# Patient Record
Sex: Male | Born: 1960 | Race: White | Hispanic: No | Marital: Married | Smoking: Never smoker
Health system: Southern US, Community
[De-identification: ages and names within clinical notes are randomized; demographics above are authoritative.]

## PROBLEM LIST (undated history)

## (undated) DIAGNOSIS — I10 Essential (primary) hypertension: Secondary | ICD-10-CM

## (undated) DIAGNOSIS — D649 Anemia, unspecified: Secondary | ICD-10-CM

## (undated) DIAGNOSIS — Z8719 Personal history of other diseases of the digestive system: Secondary | ICD-10-CM

## (undated) DIAGNOSIS — J189 Pneumonia, unspecified organism: Secondary | ICD-10-CM

## (undated) DIAGNOSIS — K5732 Diverticulitis of large intestine without perforation or abscess without bleeding: Secondary | ICD-10-CM

## (undated) DIAGNOSIS — E119 Type 2 diabetes mellitus without complications: Secondary | ICD-10-CM

## (undated) DIAGNOSIS — T8859XA Other complications of anesthesia, initial encounter: Secondary | ICD-10-CM

## (undated) HISTORY — PX: COLONOSCOPY WITH ESOPHAGOGASTRODUODENOSCOPY (EGD): SHX5779

## (undated) HISTORY — PX: HERNIA REPAIR: SHX51

## (undated) HISTORY — PX: COLON SURGERY: SHX602

## (undated) HISTORY — PX: LAPAROSCOPIC INGUINAL HERNIA WITH UMBILICAL HERNIA: SHX5658

---

## 2012-07-23 ENCOUNTER — Encounter: Payer: Self-pay | Admitting: Gastroenterology

## 2012-08-16 ENCOUNTER — Ambulatory Visit: Payer: Self-pay | Admitting: Gastroenterology

## 2020-11-08 ENCOUNTER — Other Ambulatory Visit: Payer: Self-pay | Admitting: Orthopedic Surgery

## 2020-11-16 ENCOUNTER — Encounter
Admission: RE | Admit: 2020-11-16 | Discharge: 2020-11-16 | Disposition: A | Discharge status: Home / Self Care | Payer: BC Managed Care – PPO | Source: Ambulatory Visit | Attending: Orthopedic Surgery | Admitting: Orthopedic Surgery

## 2020-11-16 ENCOUNTER — Other Ambulatory Visit: Payer: Self-pay

## 2020-11-16 DIAGNOSIS — Z01818 Encounter for other preprocedural examination: Secondary | ICD-10-CM | POA: Diagnosis present

## 2020-11-16 HISTORY — DX: Anemia, unspecified: D64.9

## 2020-11-16 HISTORY — DX: Pneumonia, unspecified organism: J18.9

## 2020-11-16 HISTORY — DX: Diverticulitis of large intestine without perforation or abscess without bleeding: K57.32

## 2020-11-16 HISTORY — DX: Type 2 diabetes mellitus without complications: E11.9

## 2020-11-16 HISTORY — DX: Essential (primary) hypertension: I10

## 2020-11-16 HISTORY — DX: Personal history of other diseases of the digestive system: Z87.19

## 2020-11-16 HISTORY — DX: Other complications of anesthesia, initial encounter: T88.59XA

## 2020-11-16 LAB — COMPREHENSIVE METABOLIC PANEL
ALT: 19 U/L (ref 0–44)
AST: 14 U/L — ABNORMAL LOW (ref 15–41)
Albumin: 3.9 g/dL (ref 3.5–5.0)
Alkaline Phosphatase: 60 U/L (ref 38–126)
Anion gap: 9 (ref 5–15)
BUN: 27 mg/dL — ABNORMAL HIGH (ref 8–23)
CO2: 24 mmol/L (ref 22–32)
Calcium: 9 mg/dL (ref 8.9–10.3)
Chloride: 103 mmol/L (ref 98–111)
Creatinine, Ser: 1.07 mg/dL (ref 0.61–1.24)
GFR, Estimated: >60 mL/min (ref >60)
Glucose, Bld: 260 mg/dL — ABNORMAL HIGH (ref 70–99)
Potassium: 4.3 mmol/L (ref 3.5–5.1)
Sodium: 136 mmol/L (ref 135–145)
Total Bilirubin: 1.1 mg/dL (ref 0.3–1.2)
Total Protein: 7.4 g/dL (ref 6.5–8.1)

## 2020-11-16 LAB — URINALYSIS, ROUTINE W REFLEX MICROSCOPIC
Bacteria, UA: NONE SEEN
Bilirubin Urine: NEGATIVE
Glucose, UA: >=500 mg/dL — AB
Hgb urine dipstick: NEGATIVE
Ketones, ur: NEGATIVE mg/dL
Leukocytes,Ua: NEGATIVE
Nitrite: NEGATIVE
Protein, ur: NEGATIVE mg/dL
Specific Gravity, Urine: 1.029 (ref 1.005–1.030)
pH: 5 (ref 5.0–8.0)

## 2020-11-16 LAB — CBC WITH DIFFERENTIAL/PLATELET
Abs Immature Granulocytes: 0.03 10*3/uL (ref 0.00–0.07)
Basophils Absolute: 0.1 10*3/uL (ref 0.0–0.1)
Basophils Relative: 1 %
Eosinophils Absolute: 0.3 10*3/uL (ref 0.0–0.5)
Eosinophils Relative: 4 %
HCT: 44.2 % (ref 39.0–52.0)
Hemoglobin: 15.5 g/dL (ref 13.0–17.0)
Immature Granulocytes: 0 %
Lymphocytes Relative: 23 %
Lymphs Abs: 1.7 10*3/uL (ref 0.7–4.0)
MCH: 32.1 pg (ref 26.0–34.0)
MCHC: 35.1 g/dL (ref 30.0–36.0)
MCV: 91.5 fL (ref 80.0–100.0)
Monocytes Absolute: 0.6 10*3/uL (ref 0.1–1.0)
Monocytes Relative: 8 %
Neutro Abs: 4.7 10*3/uL (ref 1.7–7.7)
Neutrophils Relative %: 64 %
Platelets: 237 10*3/uL (ref 150–400)
RBC: 4.83 MIL/uL (ref 4.22–5.81)
RDW: 12.6 % (ref 11.5–15.5)
WBC: 7.3 10*3/uL (ref 4.0–10.5)
nRBC: 0 % (ref 0.0–0.2)

## 2020-11-16 LAB — TYPE AND SCREEN
ABO/RH(D): O POS
Antibody Screen: NEGATIVE

## 2020-11-16 LAB — SURGICAL PCR SCREEN
MRSA, PCR: NEGATIVE
Staphylococcus aureus: POSITIVE — AB

## 2020-11-16 NOTE — Patient Instructions (Addendum)
Your procedure is scheduled on: 11/25/20 - Thursday Report to the Registration Desk on the 1st floor of the [facility]. To find out your arrival time, please call [phone] between 1PM - 3PM on: 11/24/20 - Wednesday - Report to [facility] on 11/23/20 between 8am and 1200 for your Covid test.  REMEMBER: Instructions that are not followed completely may result in serious medical risk, up to and including death; or upon the discretion of your surgeon and anesthesiologist your surgery may need to be rescheduled.  Do not eat food after midnight the night before surgery.  No gum chewing, lozengers or hard candies.  You may however, drink CLEAR liquids up to 2 hours before you are scheduled to arrive for your surgery. Do not drink anything within 2 hours of your scheduled arrival time. Type 1 and Type 2 diabetics should only drink water.  TAKE THESE MEDICATIONS THE MORNING OF SURGERY WITH A SIP OF WATER: none  - Stop taking FARXIGA 10 MG TABS tablet 72 hours before procedure beginning 11/22/20 .  Stop Metformin  2 days prior to surgery. Do not take 06/07, 06/08 and do not take the morning of surgery.  One week prior to surgery: Stop Anti-inflammatories (NSAIDS) such as Advil, Aleve, Ibuprofen, Motrin, Naproxen, Naprosyn and Aspirin based products such as Excedrin, Goodys Powder, BC Powder.  Stop ANY OVER THE COUNTER supplements until after surgery.  You may  take Tylenol if needed for pain up until the day of surgery.  No Alcohol for 24 hours before or after surgery.  No Smoking including e-cigarettes for 24 hours prior to surgery.  No chewable tobacco products for at least 6 hours prior to surgery.  No nicotine patches on the day of surgery.  Do not use any "recreational" drugs for at least a week prior to your surgery.  Please be advised that the combination of cocaine and anesthesia may have negative outcomes, up to and including death. If you test positive for cocaine, your  surgery will be cancelled.  On the morning of surgery brush your teeth with toothpaste and water, you may rinse your mouth with mouthwash if you wish. Do not swallow any toothpaste or mouthwash.  Do not wear jewelry, make-up, hairpins, clips or nail polish.  Do not wear lotions, powders, or perfumes.   Do not shave body from the neck down 48 hours prior to surgery just in case you cut yourself which could leave a site for infection.  Also, freshly shaved skin may become irritated if using the CHG soap.  Contact lenses, hearing aids and dentures may not be worn into surgery.  Do not bring valuables to the hospital. [facility] is not responsible for any missing/lost belongings or valuables.   Use CHG Soap or wipes as directed on instruction sheet.  Notify your doctor if there is any change in your medical condition (cold, fever, infection).  Wear comfortable clothing (specific to your surgery type) to the hospital.  Plan for stool softeners for home use; pain medications have a tendency to cause constipation. You can also help prevent constipation by eating foods high in fiber such as fruits and vegetables and drinking plenty of fluids as your diet allows.  After surgery, you can help prevent lung complications by doing breathing exercises.  Take deep breaths and cough every 1-2 hours. Your doctor may order a device called an Incentive Spirometer to help you take deep breaths. When coughing or sneezing, hold a pillow firmly against your incision  with both hands. This is called "splinting." Doing this helps protect your incision. It also decreases belly discomfort.  If you are being admitted to the hospital overnight, leave your suitcase in the car. After surgery it may be brought to your room.  If you are being discharged the day of surgery, you will not be allowed to drive home. You will need a responsible adult (18 years or older) to drive you home and stay with you that night.    If you are taking public transportation, you will need to have a responsible adult (18 years or older) with you. Please confirm with your physician that it is acceptable to use public transportation.   Please call the Pre-admissions Testing Dept. at [phone] if you have any questions about these instructions.  Surgery Visitation Policy:  Patients undergoing a surgery or procedure may have one family member or support person with them as long as that person is not COVID-19 positive or experiencing its symptoms.  That person may remain in the waiting area during the procedure.  Inpatient Visitation:    Visiting hours are 7 a.m. to 8 p.m. Inpatients will be allowed two visitors daily. The visitors may change each day during the patient's stay. No visitors under the age of 26. Any visitor under the age of 59 must be accompanied by an adult. The visitor must pass COVID-19 screenings, use hand sanitizer when entering and exiting the patient's room and wear a mask at all times, including in the patient's room. Patients must also wear a mask when staff or their visitor are in the room. Masking is required regardless of vaccination status.  Total Hip/Knee Replacement Preoperative Educational Video  To better prepare for surgery, please view our videos that explain the physical activity and discharge planning required to have the best surgical recovery at [facility].  [email or web address]      Questions? Call [phone] or email [email or web address]

## 2020-11-23 ENCOUNTER — Other Ambulatory Visit: Payer: Self-pay

## 2020-11-23 ENCOUNTER — Other Ambulatory Visit
Admission: RE | Admit: 2020-11-23 | Discharge: 2020-11-23 | Disposition: A | Discharge status: Home / Self Care | Payer: BC Managed Care – PPO | Source: Ambulatory Visit | Attending: Orthopedic Surgery | Admitting: Orthopedic Surgery

## 2020-11-23 DIAGNOSIS — Z01812 Encounter for preprocedural laboratory examination: Secondary | ICD-10-CM | POA: Insufficient documentation

## 2020-11-23 DIAGNOSIS — Z20822 Contact with and (suspected) exposure to covid-19: Secondary | ICD-10-CM | POA: Insufficient documentation

## 2020-11-23 LAB — SARS CORONAVIRUS 2 (TAT 6-24 HRS): SARS Coronavirus 2: NEGATIVE

## 2020-11-25 ENCOUNTER — Inpatient Hospital Stay: Payer: BC Managed Care – PPO

## 2020-11-25 ENCOUNTER — Inpatient Hospital Stay: Payer: BC Managed Care – PPO | Admitting: Anesthesiology

## 2020-11-25 ENCOUNTER — Encounter
Admission: RE | Disposition: A | Discharge status: Home / Self Care | Payer: Self-pay | Source: Home / Self Care | Attending: Orthopedic Surgery

## 2020-11-25 ENCOUNTER — Inpatient Hospital Stay
Admission: RE | Admit: 2020-11-25 | Discharge: 2020-12-04 | DRG: 469 | Disposition: A | Discharge status: Home / Self Care | Payer: BC Managed Care – PPO | Attending: Orthopedic Surgery | Admitting: Orthopedic Surgery

## 2020-11-25 ENCOUNTER — Encounter: Payer: Self-pay | Admitting: Orthopedic Surgery

## 2020-11-25 DIAGNOSIS — Z20822 Contact with and (suspected) exposure to covid-19: Secondary | ICD-10-CM | POA: Diagnosis present

## 2020-11-25 DIAGNOSIS — R319 Hematuria, unspecified: Secondary | ICD-10-CM | POA: Diagnosis not present

## 2020-11-25 DIAGNOSIS — Z7984 Long term (current) use of oral hypoglycemic drugs: Secondary | ICD-10-CM

## 2020-11-25 DIAGNOSIS — U071 COVID-19: Secondary | ICD-10-CM | POA: Diagnosis not present

## 2020-11-25 DIAGNOSIS — E119 Type 2 diabetes mellitus without complications: Secondary | ICD-10-CM | POA: Diagnosis present

## 2020-11-25 DIAGNOSIS — Z833 Family history of diabetes mellitus: Secondary | ICD-10-CM

## 2020-11-25 DIAGNOSIS — Z79891 Long term (current) use of opiate analgesic: Secondary | ICD-10-CM

## 2020-11-25 DIAGNOSIS — I119 Hypertensive heart disease without heart failure: Secondary | ICD-10-CM | POA: Diagnosis present

## 2020-11-25 DIAGNOSIS — Z419 Encounter for procedure for purposes other than remedying health state, unspecified: Secondary | ICD-10-CM

## 2020-11-25 DIAGNOSIS — Z79899 Other long term (current) drug therapy: Secondary | ICD-10-CM

## 2020-11-25 DIAGNOSIS — Z6841 Body Mass Index (BMI) 40.0 and over, adult: Secondary | ICD-10-CM

## 2020-11-25 DIAGNOSIS — R300 Dysuria: Secondary | ICD-10-CM | POA: Diagnosis not present

## 2020-11-25 DIAGNOSIS — G8918 Other acute postprocedural pain: Secondary | ICD-10-CM

## 2020-11-25 DIAGNOSIS — Z96649 Presence of unspecified artificial hip joint: Secondary | ICD-10-CM

## 2020-11-25 DIAGNOSIS — M1611 Unilateral primary osteoarthritis, right hip: Secondary | ICD-10-CM | POA: Diagnosis present

## 2020-11-25 HISTORY — PX: TOTAL HIP ARTHROPLASTY: SHX124

## 2020-11-25 LAB — POCT I-STAT, CHEM 8
BUN: 23 mg/dL (ref 8–23)
Calcium, Ion: 1.14 mmol/L — ABNORMAL LOW (ref 1.15–1.40)
Chloride: 104 mmol/L (ref 98–111)
Creatinine, Ser: 0.9 mg/dL (ref 0.61–1.24)
Glucose, Bld: 297 mg/dL — ABNORMAL HIGH (ref 70–99)
HCT: 44 % (ref 39.0–52.0)
Hemoglobin: 15 g/dL (ref 13.0–17.0)
Potassium: 4.2 mmol/L (ref 3.5–5.1)
Sodium: 136 mmol/L (ref 135–145)
TCO2: 20 mmol/L — ABNORMAL LOW (ref 22–32)

## 2020-11-25 LAB — ABO/RH: ABO/RH(D): O POS

## 2020-11-25 LAB — CBC
HCT: 39.3 % (ref 39.0–52.0)
Hemoglobin: 13.4 g/dL (ref 13.0–17.0)
MCH: 31.9 pg (ref 26.0–34.0)
MCHC: 34.1 g/dL (ref 30.0–36.0)
MCV: 93.6 fL (ref 80.0–100.0)
Platelets: 223 10*3/uL (ref 150–400)
RBC: 4.2 MIL/uL — ABNORMAL LOW (ref 4.22–5.81)
RDW: 12.3 % (ref 11.5–15.5)
WBC: 11.1 10*3/uL — ABNORMAL HIGH (ref 4.0–10.5)
nRBC: 0 % (ref 0.0–0.2)

## 2020-11-25 LAB — CREATININE, SERUM
Creatinine, Ser: 1.04 mg/dL (ref 0.61–1.24)
GFR, Estimated: >60 mL/min (ref >60)

## 2020-11-25 LAB — GLUCOSE, CAPILLARY
Glucose-Capillary: 204 mg/dL — ABNORMAL HIGH (ref 70–99)
Glucose-Capillary: 274 mg/dL — ABNORMAL HIGH (ref 70–99)

## 2020-11-25 SURGERY — ARTHROPLASTY, HIP, TOTAL, ANTERIOR APPROACH
Anesthesia: Spinal | Site: Hip | Laterality: Right

## 2020-11-25 MED ORDER — FENTANYL CITRATE (PF) 100 MCG/2ML IJ SOLN
INTRAMUSCULAR | Status: AC
  Administered 2020-11-25: 50 ug via INTRAVENOUS
  Filled 2020-11-25: qty 2

## 2020-11-25 MED ORDER — ACETAMINOPHEN 10 MG/ML IV SOLN
INTRAVENOUS | Status: AC
  Filled 2020-11-25: qty 100

## 2020-11-25 MED ORDER — SODIUM CHLORIDE 0.9 % IV SOLN: INTRAVENOUS | Status: DC

## 2020-11-25 MED ORDER — ALUM & MAG HYDROXIDE-SIMETH 200-200-20 MG/5ML PO SUSP: 30.0000 mL | ORAL | Status: DC | PRN

## 2020-11-25 MED ORDER — CHLORHEXIDINE GLUCONATE 0.12 % MT SOLN
15.0000 mL | Freq: Once | OROMUCOSAL | Status: AC
  Administered 2020-11-25: 15 mL via OROMUCOSAL

## 2020-11-25 MED ORDER — BUPIVACAINE LIPOSOME 1.3 % IJ SUSP
INTRAMUSCULAR | Status: AC
  Filled 2020-11-25: qty 20

## 2020-11-25 MED ORDER — ACETAMINOPHEN 500 MG PO TABS
1000.0000 mg | ORAL_TABLET | Freq: Four times a day (QID) | ORAL | Status: AC
  Administered 2020-11-25 – 2020-11-26 (×4): 1000 mg via ORAL
  Filled 2020-11-25 (×5): qty 2

## 2020-11-25 MED ORDER — ZOLPIDEM TARTRATE 5 MG PO TABS
5.0000 mg | ORAL_TABLET | Freq: Every evening | ORAL | Status: DC | PRN
  Administered 2020-11-26: 5 mg via ORAL
  Filled 2020-11-25: qty 1

## 2020-11-25 MED ORDER — FAMOTIDINE 20 MG PO TABS
ORAL_TABLET | ORAL | Status: AC
  Filled 2020-11-25: qty 1

## 2020-11-25 MED ORDER — HYDROXYZINE HCL 50 MG PO TABS
75.0000 mg | ORAL_TABLET | Freq: Every day | ORAL | Status: DC
  Administered 2020-11-25 – 2020-12-04 (×9): 75 mg via ORAL
  Filled 2020-11-25 (×12): qty 1

## 2020-11-25 MED ORDER — LISINOPRIL 20 MG PO TABS
30.0000 mg | ORAL_TABLET | Freq: Every day | ORAL | Status: DC
  Administered 2020-11-26 – 2020-12-04 (×9): 30 mg via ORAL
  Filled 2020-11-25 (×9): qty 1

## 2020-11-25 MED ORDER — PROPOFOL 10 MG/ML IV BOLUS
INTRAVENOUS | Status: DC | PRN
  Administered 2020-11-25: 50 mg via INTRAVENOUS
  Administered 2020-11-25: 150 mg via INTRAVENOUS
  Administered 2020-11-25: 50 mg via INTRAVENOUS

## 2020-11-25 MED ORDER — POLYETHYLENE GLYCOL 3350 17 G PO PACK
17.0000 g | PACK | Freq: Every day | ORAL | Status: DC | PRN
  Administered 2020-11-28: 17 g via ORAL
  Filled 2020-11-25: qty 1

## 2020-11-25 MED ORDER — INSULIN ASPART 100 UNIT/ML IJ SOLN: 5.0000 [IU] | Freq: Once | INTRAMUSCULAR | Status: AC

## 2020-11-25 MED ORDER — CEFAZOLIN IN SODIUM CHLORIDE 3-0.9 GM/100ML-% IV SOLN
3.0000 g | INTRAVENOUS | Status: AC
  Administered 2020-11-25: 3 g via INTRAVENOUS
  Filled 2020-11-25: qty 100

## 2020-11-25 MED ORDER — INSULIN ASPART 100 UNIT/ML IJ SOLN
0.0000 [IU] | Freq: Three times a day (TID) | INTRAMUSCULAR | Status: DC
  Administered 2020-11-26: 8 [IU] via SUBCUTANEOUS
  Administered 2020-11-26: 5 [IU] via SUBCUTANEOUS
  Administered 2020-11-27: 2 [IU] via SUBCUTANEOUS
  Administered 2020-11-27: 5 [IU] via SUBCUTANEOUS
  Administered 2020-11-27: 3 [IU] via SUBCUTANEOUS
  Administered 2020-11-28: 5 [IU] via SUBCUTANEOUS
  Administered 2020-11-28: 3 [IU] via SUBCUTANEOUS
  Administered 2020-11-28: 2 [IU] via SUBCUTANEOUS
  Administered 2020-11-29 – 2020-11-30 (×2): 3 [IU] via SUBCUTANEOUS
  Administered 2020-11-30 – 2020-12-01 (×2): 2 [IU] via SUBCUTANEOUS
  Administered 2020-12-01: 3 [IU] via SUBCUTANEOUS
  Administered 2020-12-01: 5 [IU] via SUBCUTANEOUS
  Administered 2020-12-02: 8 [IU] via SUBCUTANEOUS
  Filled 2020-11-25 (×16): qty 1

## 2020-11-25 MED ORDER — ACETAMINOPHEN 10 MG/ML IV SOLN
INTRAVENOUS | Status: DC | PRN
  Administered 2020-11-25: 1000 mg via INTRAVENOUS

## 2020-11-25 MED ORDER — FAMOTIDINE 20 MG PO TABS
20.0000 mg | ORAL_TABLET | Freq: Once | ORAL | Status: AC
  Administered 2020-11-25: 20 mg via ORAL

## 2020-11-25 MED ORDER — BISACODYL 10 MG RE SUPP
10.0000 mg | Freq: Every day | RECTAL | Status: DC | PRN
  Administered 2020-12-02: 10 mg via RECTAL
  Filled 2020-11-25: qty 1

## 2020-11-25 MED ORDER — BUPIVACAINE-EPINEPHRINE 0.25% -1:200000 IJ SOLN
INTRAMUSCULAR | Status: DC | PRN
  Administered 2020-11-25: 30 mL

## 2020-11-25 MED ORDER — CHLORHEXIDINE GLUCONATE 0.12 % MT SOLN
OROMUCOSAL | Status: AC
  Filled 2020-11-25: qty 15

## 2020-11-25 MED ORDER — ENOXAPARIN SODIUM 40 MG/0.4ML IJ SOSY
40.0000 mg | PREFILLED_SYRINGE | INTRAMUSCULAR | Status: DC
  Administered 2020-11-26 – 2020-12-04 (×9): 40 mg via SUBCUTANEOUS
  Filled 2020-11-25 (×9): qty 0.4

## 2020-11-25 MED ORDER — OXYCODONE HCL 5 MG PO TABS
5.0000 mg | ORAL_TABLET | ORAL | Status: DC | PRN
  Administered 2020-11-29 (×2): 10 mg via ORAL
  Administered 2020-11-29: 5 mg via ORAL
  Administered 2020-11-30 – 2020-12-02 (×6): 10 mg via ORAL
  Administered 2020-12-03: 5 mg via ORAL
  Administered 2020-12-03: 10 mg via ORAL
  Filled 2020-11-25 (×20): qty 2

## 2020-11-25 MED ORDER — METFORMIN HCL ER 500 MG PO TB24
500.0000 mg | ORAL_TABLET | Freq: Every day | ORAL | Status: DC
  Administered 2020-11-26 – 2020-12-04 (×9): 500 mg via ORAL
  Filled 2020-11-25 (×9): qty 1

## 2020-11-25 MED ORDER — SODIUM CHLORIDE 0.9 % IV SOLN
INTRAVENOUS | Status: DC | PRN
  Administered 2020-11-25: 60 mL

## 2020-11-25 MED ORDER — FENTANYL CITRATE (PF) 100 MCG/2ML IJ SOLN
25.0000 ug | INTRAMUSCULAR | Status: DC | PRN
  Administered 2020-11-25: 50 ug via INTRAVENOUS
  Administered 2020-11-25 (×2): 25 ug via INTRAVENOUS

## 2020-11-25 MED ORDER — ONDANSETRON HCL 4 MG PO TABS: 4.0000 mg | ORAL_TABLET | Freq: Four times a day (QID) | ORAL | Status: DC | PRN

## 2020-11-25 MED ORDER — PANTOPRAZOLE SODIUM 40 MG PO TBEC
40.0000 mg | DELAYED_RELEASE_TABLET | Freq: Every day | ORAL | Status: DC
  Administered 2020-11-26 – 2020-12-04 (×9): 40 mg via ORAL
  Filled 2020-11-25 (×9): qty 1

## 2020-11-25 MED ORDER — MIDAZOLAM HCL 2 MG/2ML IJ SOLN
INTRAMUSCULAR | Status: AC
  Filled 2020-11-25: qty 2

## 2020-11-25 MED ORDER — MIDAZOLAM HCL 2 MG/2ML IJ SOLN
INTRAMUSCULAR | Status: AC
  Administered 2020-11-25: 1 mg via INTRAVENOUS
  Filled 2020-11-25: qty 2

## 2020-11-25 MED ORDER — BUPIVACAINE HCL (PF) 0.5 % IJ SOLN
INTRAMUSCULAR | Status: DC | PRN
  Administered 2020-11-25: 2.7 mL

## 2020-11-25 MED ORDER — PHENYLEPHRINE HCL (PRESSORS) 10 MG/ML IV SOLN
INTRAVENOUS | Status: DC | PRN
  Administered 2020-11-25: 100 ug via INTRAVENOUS

## 2020-11-25 MED ORDER — SODIUM CHLORIDE 0.9 % IV SOLN
INTRAVENOUS | Status: DC | PRN
  Administered 2020-11-25: 30 ug/min via INTRAVENOUS

## 2020-11-25 MED ORDER — MIDAZOLAM HCL 2 MG/2ML IJ SOLN: 1.0000 mg | Freq: Once | INTRAMUSCULAR | Status: AC

## 2020-11-25 MED ORDER — ONDANSETRON HCL 4 MG/2ML IJ SOLN
INTRAMUSCULAR | Status: DC | PRN
  Administered 2020-11-25: 4 mg via INTRAVENOUS

## 2020-11-25 MED ORDER — FENTANYL CITRATE (PF) 100 MCG/2ML IJ SOLN
INTRAMUSCULAR | Status: DC | PRN
  Administered 2020-11-25: 100 ug via INTRAVENOUS

## 2020-11-25 MED ORDER — ACETAMINOPHEN 325 MG PO TABS
325.0000 mg | ORAL_TABLET | Freq: Four times a day (QID) | ORAL | Status: DC | PRN
  Administered 2020-11-26 – 2020-11-28 (×2): 650 mg via ORAL
  Filled 2020-11-25 (×2): qty 2

## 2020-11-25 MED ORDER — PHENOL 1.4 % MT LIQD
1.0000 | OROMUCOSAL | Status: DC | PRN
  Filled 2020-11-25: qty 177

## 2020-11-25 MED ORDER — PROPOFOL 500 MG/50ML IV EMUL
INTRAVENOUS | Status: DC | PRN
  Administered 2020-11-25: 50 ug/kg/min via INTRAVENOUS

## 2020-11-25 MED ORDER — MENTHOL 3 MG MT LOZG
1.0000 | LOZENGE | OROMUCOSAL | Status: DC | PRN
  Filled 2020-11-25: qty 9

## 2020-11-25 MED ORDER — OXYCODONE HCL 5 MG PO TABS
10.0000 mg | ORAL_TABLET | ORAL | Status: DC | PRN
Start: 2020-11-25 — End: 2020-12-04
  Administered 2020-11-25 – 2020-11-26 (×5): 15 mg via ORAL
  Administered 2020-11-27 – 2020-11-30 (×9): 10 mg via ORAL
  Administered 2020-11-30: 15 mg via ORAL
  Administered 2020-12-01 – 2020-12-03 (×6): 10 mg via ORAL
  Administered 2020-12-04: 15 mg via ORAL
  Filled 2020-11-25 (×2): qty 2
  Filled 2020-11-25: qty 3
  Filled 2020-11-25: qty 2
  Filled 2020-11-25: qty 3
  Filled 2020-11-25: qty 2
  Filled 2020-11-25 (×3): qty 3
  Filled 2020-11-25 (×3): qty 2
  Filled 2020-11-25 (×2): qty 3

## 2020-11-25 MED ORDER — ORAL CARE MOUTH RINSE: 15.0000 mL | Freq: Once | OROMUCOSAL | Status: AC

## 2020-11-25 MED ORDER — DOCUSATE SODIUM 100 MG PO CAPS
100.0000 mg | ORAL_CAPSULE | Freq: Two times a day (BID) | ORAL | Status: DC
  Administered 2020-11-25 – 2020-12-04 (×18): 100 mg via ORAL
  Filled 2020-11-25 (×19): qty 1

## 2020-11-25 MED ORDER — FENTANYL CITRATE (PF) 100 MCG/2ML IJ SOLN
INTRAMUSCULAR | Status: AC
  Filled 2020-11-25: qty 2

## 2020-11-25 MED ORDER — FAMOTIDINE 20 MG PO TABS
20.0000 mg | ORAL_TABLET | Freq: Once | ORAL | Status: DC
  Filled 2020-11-25 (×2): qty 1

## 2020-11-25 MED ORDER — HYDROMORPHONE HCL 1 MG/ML IJ SOLN
0.5000 mg | INTRAMUSCULAR | Status: DC | PRN
Start: 2020-11-25 — End: 2020-12-04
  Administered 2020-11-25 – 2020-11-26 (×3): 1 mg via INTRAVENOUS
  Filled 2020-11-25 (×3): qty 1

## 2020-11-25 MED ORDER — METHOCARBAMOL 1000 MG/10ML IJ SOLN
500.0000 mg | Freq: Four times a day (QID) | INTRAVENOUS | Status: DC | PRN
  Filled 2020-11-25: qty 5

## 2020-11-25 MED ORDER — PROPOFOL 1000 MG/100ML IV EMUL
INTRAVENOUS | Status: AC
  Filled 2020-11-25: qty 100

## 2020-11-25 MED ORDER — CEFAZOLIN SODIUM-DEXTROSE 2-4 GM/100ML-% IV SOLN
2.0000 g | Freq: Four times a day (QID) | INTRAVENOUS | Status: AC
  Administered 2020-11-25 (×2): 2 g via INTRAVENOUS
  Filled 2020-11-25 (×4): qty 100

## 2020-11-25 MED ORDER — ONDANSETRON HCL 4 MG/2ML IJ SOLN
INTRAMUSCULAR | Status: AC
  Filled 2020-11-25: qty 2

## 2020-11-25 MED ORDER — OXYCODONE HCL 5 MG PO TABS
5.0000 mg | ORAL_TABLET | Freq: Once | ORAL | Status: AC | PRN
Start: 2020-11-25 — End: 2020-11-25
  Administered 2020-11-25: 5 mg via ORAL

## 2020-11-25 MED ORDER — DIPHENHYDRAMINE HCL 12.5 MG/5ML PO ELIX: 12.5000 mg | ORAL_SOLUTION | ORAL | Status: DC | PRN

## 2020-11-25 MED ORDER — GABAPENTIN 300 MG PO CAPS
600.0000 mg | ORAL_CAPSULE | Freq: Every day | ORAL | Status: DC
  Administered 2020-11-25 – 2020-12-04 (×10): 600 mg via ORAL
  Filled 2020-11-25 (×10): qty 2

## 2020-11-25 MED ORDER — OXYCODONE HCL 5 MG PO TABS
ORAL_TABLET | ORAL | Status: AC
  Filled 2020-11-25: qty 1

## 2020-11-25 MED ORDER — INSULIN ASPART 100 UNIT/ML IJ SOLN
INTRAMUSCULAR | Status: AC
  Administered 2020-11-25: 5 [IU] via SUBCUTANEOUS
  Filled 2020-11-25: qty 1

## 2020-11-25 MED ORDER — TRAMADOL HCL 50 MG PO TABS
50.0000 mg | ORAL_TABLET | Freq: Four times a day (QID) | ORAL | Status: DC
  Administered 2020-11-25 – 2020-12-04 (×34): 50 mg via ORAL
  Filled 2020-11-25 (×35): qty 1

## 2020-11-25 MED ORDER — NEOMYCIN-POLYMYXIN B GU 40-200000 IR SOLN
Status: AC
  Filled 2020-11-25: qty 4

## 2020-11-25 MED ORDER — DAPAGLIFLOZIN PROPANEDIOL 5 MG PO TABS
10.0000 mg | ORAL_TABLET | Freq: Every day | ORAL | Status: DC
  Administered 2020-11-26 – 2020-12-04 (×9): 10 mg via ORAL
  Filled 2020-11-25 (×10): qty 2

## 2020-11-25 MED ORDER — METHOCARBAMOL 500 MG PO TABS
500.0000 mg | ORAL_TABLET | Freq: Four times a day (QID) | ORAL | Status: DC | PRN
  Administered 2020-11-25 – 2020-12-04 (×14): 500 mg via ORAL
  Filled 2020-11-25 (×16): qty 1

## 2020-11-25 MED ORDER — SUCCINYLCHOLINE CHLORIDE 20 MG/ML IJ SOLN
INTRAMUSCULAR | Status: DC | PRN
  Administered 2020-11-25: 120 mg via INTRAVENOUS

## 2020-11-25 MED ORDER — SODIUM CHLORIDE FLUSH 0.9 % IV SOLN
INTRAVENOUS | Status: AC
  Filled 2020-11-25: qty 40

## 2020-11-25 MED ORDER — MAGNESIUM CITRATE PO SOLN
1.0000 | Freq: Once | ORAL | Status: DC | PRN
  Filled 2020-11-25 (×2): qty 296

## 2020-11-25 MED ORDER — SEMAGLUTIDE 14 MG PO TABS: 14.0000 mg | ORAL_TABLET | Freq: Every morning | ORAL | Status: DC

## 2020-11-25 MED ORDER — BUPIVACAINE-EPINEPHRINE (PF) 0.25% -1:200000 IJ SOLN
INTRAMUSCULAR | Status: AC
  Filled 2020-11-25: qty 30

## 2020-11-25 MED ORDER — MIDAZOLAM HCL 5 MG/5ML IJ SOLN
INTRAMUSCULAR | Status: DC | PRN
  Administered 2020-11-25 (×2): 1 mg via INTRAVENOUS

## 2020-11-25 MED ORDER — GLIPIZIDE 5 MG PO TABS
10.0000 mg | ORAL_TABLET | Freq: Every day | ORAL | Status: DC
  Administered 2020-11-26 – 2020-12-04 (×9): 10 mg via ORAL
  Filled 2020-11-25 (×9): qty 2

## 2020-11-25 MED ORDER — ONDANSETRON HCL 4 MG/2ML IJ SOLN: 4.0000 mg | Freq: Four times a day (QID) | INTRAMUSCULAR | Status: DC | PRN

## 2020-11-25 MED ORDER — OXYCODONE HCL 5 MG/5ML PO SOLN: 5.0000 mg | Freq: Once | ORAL | Status: AC | PRN

## 2020-11-25 SURGICAL SUPPLY — 61 items
BLADE SAGITTAL AGGR TOOTH XLG (BLADE) ×3 IMPLANT
BNDG COHESIVE 6X5 TAN STRL LF (GAUZE/BANDAGES/DRESSINGS) ×9 IMPLANT
CANISTER SUCT 1200ML W/VALVE (MISCELLANEOUS) ×3 IMPLANT
CANISTER WOUND CARE 500ML ATS (WOUND CARE) ×3 IMPLANT
CHLORAPREP W/TINT 26 (MISCELLANEOUS) ×3 IMPLANT
COVER BACK TABLE REUSABLE LG (DRAPES) ×3 IMPLANT
COVER WAND RF STERILE (DRAPES) ×3 IMPLANT
DRAPE 3/4 80X56 (DRAPES) ×9 IMPLANT
DRAPE C-ARM XRAY 36X54 (DRAPES) ×3 IMPLANT
DRAPE INCISE IOBAN 66X60 STRL (DRAPES) IMPLANT
DRAPE POUCH INSTRU U-SHP 10X18 (DRAPES) ×3 IMPLANT
DRESSING SURGICEL FIBRLLR 1X2 (HEMOSTASIS) ×2 IMPLANT
DRSG MEPILEX SACRM 8.7X9.8 (GAUZE/BANDAGES/DRESSINGS) ×3 IMPLANT
DRSG OPSITE POSTOP 4X8 (GAUZE/BANDAGES/DRESSINGS) ×6 IMPLANT
DRSG SURGICEL FIBRILLAR 1X2 (HEMOSTASIS) ×6
ELECT BLADE 6.5 EXT (BLADE) ×3 IMPLANT
ELECT REM PT RETURN 9FT ADLT (ELECTROSURGICAL) ×3
ELECTRODE REM PT RTRN 9FT ADLT (ELECTROSURGICAL) ×1 IMPLANT
GLOVE SURG SYN 9.0  PF PI (GLOVE) ×4
GLOVE SURG SYN 9.0 PF PI (GLOVE) ×2 IMPLANT
GLOVE SURG UNDER POLY LF SZ9 (GLOVE) ×3 IMPLANT
GOWN SRG 2XL LVL 4 RGLN SLV (GOWNS) ×1 IMPLANT
GOWN STRL NON-REIN 2XL LVL4 (GOWNS) ×2
GOWN STRL REUS W/ TWL LRG LVL3 (GOWN DISPOSABLE) ×1 IMPLANT
GOWN STRL REUS W/TWL LRG LVL3 (GOWN DISPOSABLE) ×2
HEMOVAC 400CC 10FR (MISCELLANEOUS) IMPLANT
HIP DBL LINER 54X28 (Liner) ×3 IMPLANT
HIP FEM HD S 28 (Head) ×3 IMPLANT
HOLDER FOLEY CATH W/STRAP (MISCELLANEOUS) ×3 IMPLANT
HOOD PEEL AWAY FLYTE STAYCOOL (MISCELLANEOUS) ×3 IMPLANT
IRRIGATION SURGIPHOR STRL (IV SOLUTION) IMPLANT
KIT PREVENA INCISION MGT 13 (CANNISTER) ×3 IMPLANT
MANIFOLD NEPTUNE II (INSTRUMENTS) ×3 IMPLANT
MASTERLOC HIP LATERAL S7 (Hips) ×3 IMPLANT
MAT ABSORB  FLUID 56X50 GRAY (MISCELLANEOUS) ×2
MAT ABSORB FLUID 56X50 GRAY (MISCELLANEOUS) ×1 IMPLANT
NDL SAFETY ECLIPSE 18X1.5 (NEEDLE) ×1 IMPLANT
NEEDLE HYPO 18GX1.5 SHARP (NEEDLE) ×2
NEEDLE SPNL 20GX3.5 QUINCKE YW (NEEDLE) ×6 IMPLANT
NS IRRIG 1000ML POUR BTL (IV SOLUTION) ×3 IMPLANT
PACK HIP COMPR (MISCELLANEOUS) ×3 IMPLANT
SCALPEL PROTECTED #10 DISP (BLADE) ×6 IMPLANT
SHELL ACETABULAR SZ 54 DM (Shell) ×3 IMPLANT
SOL PREP PVP 2OZ (MISCELLANEOUS) ×3
SOLUTION PREP PVP 2OZ (MISCELLANEOUS) ×1 IMPLANT
SPONGE DRAIN TRACH 4X4 STRL 2S (GAUZE/BANDAGES/DRESSINGS) ×3 IMPLANT
STAPLER SKIN PROX 35W (STAPLE) ×3 IMPLANT
STRAP SAFETY 5IN WIDE (MISCELLANEOUS) ×3 IMPLANT
SUT DVC 2 QUILL PDO  T11 36X36 (SUTURE) ×2
SUT DVC 2 QUILL PDO T11 36X36 (SUTURE) ×1 IMPLANT
SUT SILK 0 (SUTURE) ×2
SUT SILK 0 30XBRD TIE 6 (SUTURE) ×1 IMPLANT
SUT V-LOC 90 ABS DVC 3-0 CL (SUTURE) ×3 IMPLANT
SUT VIC AB 1 CT1 36 (SUTURE) ×3 IMPLANT
SYR 20ML LL LF (SYRINGE) ×3 IMPLANT
SYR 30ML LL (SYRINGE) ×3 IMPLANT
SYR 50ML LL SCALE MARK (SYRINGE) ×6 IMPLANT
SYR BULB IRRIG 60ML STRL (SYRINGE) ×3 IMPLANT
TAPE MICROFOAM 4IN (TAPE) ×3 IMPLANT
TOWEL OR 17X26 4PK STRL BLUE (TOWEL DISPOSABLE) ×3 IMPLANT
TRAY FOLEY MTR SLVR 16FR STAT (SET/KITS/TRAYS/PACK) ×3 IMPLANT

## 2020-11-25 NOTE — Anesthesia Preprocedure Evaluation (Signed)
Anesthesia Evaluation  Patient identified by MRN, date of birth, ID band Patient awake    Reviewed: Allergy & Precautions, NPO status , Patient's Chart, lab work & pertinent test results  History of Anesthesia Complications (+) history of anesthetic complications  Airway Mallampati: III  TM Distance: <3 FB Neck ROM: full    Dental  (+) Chipped, Poor Dentition   Pulmonary    Pulmonary exam normal        Cardiovascular Exercise Tolerance: Good hypertension, (-) angina(-) Past MI Normal cardiovascular exam     Neuro/Psych negative neurological ROS  negative psych ROS   GI/Hepatic Neg liver ROS, hiatal hernia, neg GERD  ,  Endo/Other  diabetes, Type 2  Renal/GU      Musculoskeletal   Abdominal   Peds  Hematology negative hematology ROS (+)   Anesthesia Other Findings Past Medical History: No date: Anemia No date: Complication of anesthesia     Comment:  woke up during colonoscopy  No date: Diabetes mellitus without complication (HCC) No date: Diverticulitis large intestine No date: History of hiatal hernia     Comment:  has been repaired No date: Hypertension No date: Pneumonia  Past Surgical History: No date: COLON SURGERY No date: COLONOSCOPY WITH ESOPHAGOGASTRODUODENOSCOPY (EGD) No date: HERNIA REPAIR     Comment:  hiatal No date: LAPAROSCOPIC INGUINAL HERNIA WITH UMBILICAL HERNIA  BMI    Body Mass Index: 41.06 kg/m      Reproductive/Obstetrics negative OB ROS                             Anesthesia Physical Anesthesia Plan  ASA: 3  Anesthesia Plan: Spinal   Post-op Pain Management:    Induction:   PONV Risk Score and Plan:   Airway Management Planned: Natural Airway and Nasal Cannula  Additional Equipment:   Intra-op Plan:   Post-operative Plan:   Informed Consent: I have reviewed the patients History and Physical, chart, labs and discussed the procedure  including the risks, benefits and alternatives for the proposed anesthesia with the patient or authorized representative who has indicated his/her understanding and acceptance.     Dental Advisory Given  Plan Discussed with: Anesthesiologist, CRNA and Surgeon  Anesthesia Plan Comments: (Patient reports no bleeding problems and no anticoagulant use.  Plan for spinal with backup GA  Patient consented for risks of anesthesia including but not limited to:  - adverse reactions to medications - damage to eyes, teeth, lips or other oral mucosa - nerve damage due to positioning  - risk of bleeding, infection and or nerve damage from spinal that could lead to paralysis - risk of headache or failed spinal - damage to teeth, lips or other oral mucosa - sore throat or hoarseness - damage to heart, brain, nerves, lungs, other parts of body or loss of life  Patient voiced understanding.)        Anesthesia Quick Evaluation

## 2020-11-25 NOTE — Transfer of Care (Signed)
Immediate Anesthesia Transfer of Care Note  Patient: Noah Orozco  Procedure(s) Performed: TOTAL HIP ARTHROPLASTY ANTERIOR APPROACH (Right: Hip)  Patient Location: PACU  Anesthesia Type: Spinal  Level of Consciousness: awake, alert  and oriented  Airway & Oxygen Therapy: Patient Spontanous Breathing  Post-op Assessment: Post -op Vital signs reviewed and stable  Post vital signs: stable  Last Vitals:  Vitals Value Taken Time  BP 125/78   Temp    Pulse 84 11/25/20 1332  Resp 15   SpO2 99 % 11/25/20 1332  Vitals shown include unvalidated device data.  Last Pain:  Vitals:   11/25/20 1002  PainSc: 4          Complications: No notable events documented.

## 2020-11-25 NOTE — Anesthesia Postprocedure Evaluation (Signed)
Anesthesia Post Note  Patient: Noah Orozco  Procedure(s) Performed: TOTAL HIP ARTHROPLASTY ANTERIOR APPROACH (Right: Hip)  Patient location during evaluation: PACU Anesthesia Type: Spinal Level of consciousness: awake and alert Pain management: pain level controlled Vital Signs Assessment: post-procedure vital signs reviewed and stable Respiratory status: spontaneous breathing, nonlabored ventilation, respiratory function stable and patient connected to nasal cannula oxygen Cardiovascular status: blood pressure returned to baseline and stable Postop Assessment: no apparent nausea or vomiting Anesthetic complications: no   No notable events documented.   Last Vitals:  Vitals:   11/25/20 1400 11/25/20 1415  BP: 131/66 135/76  Pulse: 68 73  Resp: 12 12  Temp:    SpO2: 96% 98%    Last Pain:  Vitals:   11/25/20 1418  PainSc: 5                  Cleda Mccreedy Lerae Langham

## 2020-11-25 NOTE — Anesthesia Procedure Notes (Signed)
Spinal  Patient location during procedure: OR Start time: 11/25/2020 11:34 AM End time: 11/25/2020 11:36 AM Reason for block: surgical anesthesia Staffing Performed: resident/CRNA  Anesthesiologist: Piscitello, Precious Haws, MD Resident/CRNA: Aline Brochure, CRNA Preanesthetic Checklist Completed: patient identified, IV checked, site marked, risks and benefits discussed, surgical consent, monitors and equipment checked, pre-op evaluation and timeout performed Spinal Block Patient position: sitting Prep: ChloraPrep Patient monitoring: heart rate, continuous pulse ox, blood pressure and cardiac monitor Approach: midline Location: L3-4 Injection technique: single-shot Needle Needle type: Introducer and Pencan  Needle gauge: 24 G Needle length: 9 cm Assessment Sensory level: T10 Events: CSF return Additional Notes Negative paresthesia. Negative blood return. Positive free-flowing CSF. Expiration date of kit checked and confirmed. Patient tolerated procedure well, without complications.

## 2020-11-25 NOTE — Plan of Care (Signed)
  Problem: Education: Goal: Knowledge of General Education information will improve Description: Including pain rating scale, medication(s)/side effects and non-pharmacologic comfort measures Outcome: Progressing   Problem: Health Behavior/Discharge Planning: Goal: Ability to manage health-related needs will improve Outcome: Progressing   Problem: Health Behavior/Discharge Planning: Goal: Ability to manage health-related needs will improve Outcome: Progressing   Problem: Education: Goal: Knowledge of General Education information will improve Description: Including pain rating scale, medication(s)/side effects and non-pharmacologic comfort measures Outcome: Progressing   Problem: Health Behavior/Discharge Planning: Goal: Ability to manage health-related needs will improve Outcome: Progressing   Problem: Clinical Measurements: Goal: Ability to maintain clinical measurements within normal limits will improve Outcome: Progressing Goal: Will remain free from infection Outcome: Progressing Goal: Diagnostic test results will improve Outcome: Progressing Goal: Respiratory complications will improve Outcome: Progressing Goal: Cardiovascular complication will be avoided Outcome: Progressing   Problem: Nutrition: Goal: Adequate nutrition will be maintained Outcome: Progressing   Problem: Coping: Goal: Level of anxiety will decrease Outcome: Progressing   Problem: Elimination: Goal: Will not experience complications related to bowel motility Outcome: Progressing Goal: Will not experience complications related to urinary retention Outcome: Progressing   Problem: Pain Managment: Goal: General experience of comfort will improve Outcome: Progressing   Problem: Safety: Goal: Ability to remain free from injury will improve Outcome: Progressing

## 2020-11-25 NOTE — Op Note (Signed)
11/25/2020  1:34 PM  PATIENT:  Noah Orozco  60 y.o. male  PRE-OPERATIVE DIAGNOSIS:  Primary osteoarthritis of right hip M16.11  POST-OPERATIVE DIAGNOSIS:  Primary osteoarthritis of right hip M16.11  PROCEDURE:  Procedure(s): TOTAL HIP ARTHROPLASTY ANTERIOR APPROACH (Right)  SURGEON: Leitha Schuller, MD  ASSISTANTS: None  ANESTHESIA:   spinal and general  EBL:  Total I/O In: 200 [IV Piggyback:200] Out: 300 [Urine:100; Blood:200]  BLOOD ADMINISTERED:none  DRAINS:  Incisional wound VAC    LOCAL MEDICATIONS USED:  MARCAINE    and OTHER Exparel  SPECIMEN: Right femoral head  DISPOSITION OF SPECIMEN:  PATHOLOGY  COUNTS:  YES  TOURNIQUET:  * No tourniquets in log *  IMPLANTS: Medacta Massterloc 7 lateralized stem with 54 mm Mpact DM cup and liner, metal S 28 mm head  DICTATION: .Dragon Dictation   The patient was brought to the operating room and after spinal anesthesia was obtained patient was placed on the operative table with the ipsilateral foot into the Medacta attachment, contralateral leg on a well-padded table. C-arm was brought in and preop template x-ray taken. After prepping and draping in usual sterile fashion appropriate patient identification and timeout procedures were completed. Anterior approach to the hip was obtained and centered over the greater trochanter and TFL muscle.  The patient responded somewhat to this and so he was placed under general anesthesia.  The subcutaneous tissue was incised hemostasis being achieved by electrocautery. TFL fascia was incised and the muscle retracted laterally deep retractor placed. The lateral femoral circumflex vessels were identified and ligated. The anterior capsule was exposed and a capsulotomy performed. The neck was identified and a femoral neck cut carried out with a saw. The head was removed without difficulty and showed sclerotic femoral head and acetabulum. Reaming was carried out to 52 mm and a 54 mm cup trial gave  appropriate tightness to the acetabular component a 54 DM cup was impacted into position. The leg was then externally rotated and ischiofemoral and pubofemoral releases carried out. The femur was sequentially broached to a size 7, size 7 lateralized stem with S head trials were placed and the final components chosen. The 7 lateralized stem was inserted along with a metal S 28 mm head and 54 mm liner. The hip was reduced and was stable the wound was thoroughly irrigated with fibrillar placed along the posterior capsule and medial neck. The deep fascia ws closed using a heavy Quill after infiltration of 30 cc of quarter percent Sensorcaine with epinephrine.3-0 diluted with Exparel throughout the case, V-loc to close the skin with skin staples.  Incisional wound VAC applied and patient was sent to recovery in stable condition.   PLAN OF CARE: Admit to inpatient

## 2020-11-25 NOTE — H&P (Signed)
Chief Complaint  Patient presents with   Right Hip - Pain    History of the Present Illness: Noah Orozco is a 60 y.o. male here today.   The patient presents for evaluation of right hip osteoarthritis. He has been seen at [facility], and they have held off on surgery because of his BMI. No evidence of acute trauma. He additionally is a diabetic, and his A1c is below 8 from 09/2020.  The patient states his right hip pain started in 02/2019. He states he has been through a lot of different providers prior to coming here today. He states his right hip pain is not as severe as it used to be. He states he has a lot of steps in his home. He denies a history of blood clots.  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   I have reviewed past medical, surgical, social and family history, and allergies as documented in the EMR.  Past Medical History: Past Medical History:  Diagnosis Date   Diabetes mellitus without complication (CMS-HCC)   Hypertension   Past Surgical History: Past Surgical History:  Procedure Laterality Date   CLOSURE ENTEROSTOMY LARGE/SMALL INTESTINE   ESOPHAGOSCOPY W/BAND LIGATION ESOPHAGEAL VARICIES 1997   HERNIA REPAIR   Past Family History: Family History  Problem Relation Age of Onset   Alzheimer's disease Mother   Diabetes type II Father   Medications: Current Outpatient Medications Ordered in Epic  Medication Sig Dispense Refill   blood glucose diagnostic (GLUCOSE BLOOD) test strip Test BS TID  Uncontrolled diabetes   dapagliflozin (FARXIGA) 10 mg tablet Take 1 tablet by mouth every morning   gabapentin (NEURONTIN) 300 MG capsule Take 1 capsule by mouth 3 (three) times daily   glipiZIDE (GLUCOTROL) 10 MG tablet Take 1 tablet by mouth 2 (two) times daily   hydroCHLOROthiazide (HYDRODIURIL) 12.5 MG tablet Take 1 tablet by mouth once daily   hydrOXYzine (ATARAX) 25 MG tablet Take 1 tablet by  mouth every 8 (eight) hours as needed   lisinopriL (ZESTRIL) 30 MG tablet Take 1 tablet by mouth once daily   metFORMIN (GLUCOPHAGE-XR) 500 MG XR tablet Take 2 tablets by mouth 2 (two) times daily   semaglutide (RYBELSUS) 14 mg tablet Take 1 tablet by mouth once daily   traMADoL (ULTRAM) 50 mg tablet Take 1 tablet by mouth every 6 (six) hours as needed   No current Epic-ordered facility-administered medications on file.   Allergies: No Known Allergies   Body mass index is 41.71 kg/m.  Review of Systems: A comprehensive 14 point ROS was performed, reviewed, and the pertinent orthopaedic findings are documented in the HPI.  Vitals:  11/05/20 0919  BP: (!) 144/82    General Physical Examination:   General/Constitutional: No apparent distress: well-nourished and well developed. Eyes: Pupils equal, round with synchronous movement. Lungs: Clear to auscultation HEENT: Normal Vascular: No edema, swelling or tenderness, except as noted in detailed exam. Cardiac: Heart rate and rhythm is regular. Integumentary: No impressive skin lesions present, except as noted in detailed exam. Neuro/Psych: Normal mood and affect, oriented to person, place and time.  Musculoskeletal Examination:  On exam, right hip has 0 degrees internal rotation and 20 degrees external. Antalgic gait.  Radiographs:  AP pelvis and lateral x-rays of the right hip were ordered and personally reviewed today. These show complete loss of superior joint space, large cyst in the femoral head, some slight subluxation. On the  lateral view. there are osteophytes on the inferomedial head.  X-ray Impression Severe osteoarthritis, right hip. Normal-appearing left hip. No evidence of acute trauma.  Assessment: ICD-10-CM  1. Primary osteoarthritis of right hip M16.11   Plan:  The patient has clinical findings of severe right hip osteoarthritis with A1c controlled.  We discussed the patient's x-ray findings. I explained  he has severe right hip osteoarthritis. I explained he has good A1c control, which is critical. I explained right total hip arthroplasty in detail. I gave the patient a brochure on total hip arthroplasty. We will plan on right total hip arthroplasty in the near future.  Surgical Risks:  The nature of the condition and the proposed procedure has been reviewed in detail with the patient. Surgical versus non-surgical options and prognosis for recovery have been reviewed and the inherent risks and benefits of each have been discussed including the risks of infection, bleeding, injury to nerves/blood vessels/tendons, incomplete relief of symptoms, persisting pain and/or stiffness, loss of function, complex regional pain syndrome, failure of the procedure, as appropriate.  Teeth: normal  Attestation: I, Dawn Royse, am documenting for [facility], MD utilizing Nuance DAX.    Electronically signed by Marlena Clipper, MD at 11/05/2020 7:52 PM EDT  Reviewed  H+P. No changes noted.

## 2020-11-25 NOTE — Evaluation (Signed)
Physical Therapy Evaluation Patient Details Name: Noah Orozco MRN: [id number] DOB: 02-Feb-1961 Today's Date: 11/25/2020   History of Present Illness  Pt is a 60 y.o. 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  PMH includes htn, hiatal hernia, DM, anemia, diverticulitis, PNA, colon surgery.  Clinical Impression  Prior to hospital admission, pt was ambulatory (uses [facility] for short distances and rollator for longer distances); lives with his wife in split level home.  Currently pt is min to mod assist semi-supine to sitting edge of bed; min assist with transfers; and CGA ambulating a few feet bed to recliner with RW.  Pain 5/10 at rest beginning of session and 7/10 end of session at rest R hip (increased R hip pain noted with activity).  Pt reporting being hot and sweaty after transferring to chair so pt given 2 cold wet wash cloths for face; pt's room also noted to be on 78 degrees so thermostat decreased to 68 degrees per pt request; therapist then brought pt a cold ginger-ale.  Pt's symptoms improved with rest in recliner and above interventions.  Nurse updated on pt's status.  Pt would benefit from skilled PT to address noted impairments and functional limitations (see below for any additional details).  Upon hospital discharge, anticipate pt would benefit from SNF.    Follow Up Recommendations SNF    Equipment Recommendations  Rolling walker with 5" wheels;3in1 (PT)    Recommendations for Other Services OT consult     Precautions / Restrictions Precautions Precautions: Anterior Hip;Fall Precaution Booklet Issued: No Precaution Comments: wound vac Restrictions Weight Bearing Restrictions: Yes RLE Weight Bearing: Weight bearing as tolerated      Mobility  Bed Mobility Overal bed mobility: Needs Assistance Bed Mobility: Supine to Sit     Supine to sit: Min assist;Mod assist;HOB elevated     General bed mobility comments: vc's for technique; assist for R LE and  trunk    Transfers Overall transfer level: Needs assistance Equipment used: Rolling walker (2 wheeled) Transfers: Sit to/from Stand Sit to Stand: Min assist         General transfer comment: 1st trial standing pt unable to stand with 1 assist but pt able to stand up to RW 2nd trial standing with min assist (vc's for UE/LE placement and scooting towards edge of bed prior to standing); increased effort to stand noted  Ambulation/Gait Ambulation/Gait assistance: Min guard Gait Distance (Feet): 3 Feet (bed to recliner) Assistive device: Rolling walker (2 wheeled) Gait Pattern/deviations: Antalgic Gait velocity: decreased   General Gait Details: vc's for walker use and to increase UE support through RW to offweight R LE with L LE advancement  Stairs            Wheelchair Mobility    Modified Rankin (Stroke Patients Only)       Balance Overall balance assessment: Needs assistance Sitting-balance support: No upper extremity supported;Feet supported Sitting balance-Leahy Scale: Good Sitting balance - Comments: steady sitting reaching within BOS   Standing balance support: Single extremity supported Standing balance-Leahy Scale: Fair Standing balance comment: pt requiring at least single UE support for static standing balance                             Pertinent Vitals/Pain Pain Assessment: 0-10 Pain Score: 7  Pain Location: R hip Pain Descriptors / Indicators: Aching;Sore Pain Intervention(s): Limited activity within patient's tolerance;Monitored during session;Premedicated before session;Repositioned;Ice applied Banker notified) Vitals (  HR and O2 on room air) stable and WFL throughout treatment session.    Home Living Family/patient expects to be discharged to:: Skilled nursing facility Living Arrangements: Spouse/significant other Available Help at Discharge: Family Type of Home: House Home Access: Stairs to enter Entrance Stairs-Rails:  Doctor, general practice of Steps: 5 STE B railing (unable to reach both) Home Layout: Multi-level Home Equipment: Walker - 4 wheels;Cane - single point      Prior Function Level of Independence: Independent with assistive device(s)         Comments: Ambulatory with SPC short distances and rollator longer distances for last 18 months; pt reports no falls in past 6 months     Hand Dominance        Extremity/Trunk Assessment   Upper Extremity Assessment Upper Extremity Assessment: Overall WFL for tasks assessed    Lower Extremity Assessment Lower Extremity Assessment: RLE deficits/detail (L LE WFL) RLE Deficits / Details: at least 3/5 AROM DF/PF; good quad set strength RLE: Unable to fully assess due to pain    Cervical / Trunk Assessment Cervical / Trunk Assessment: Normal  Communication   Communication: No difficulties  Cognition Arousal/Alertness: Awake/alert Behavior During Therapy: WFL for tasks assessed/performed Overall Cognitive Status: Within Functional Limits for tasks assessed                                        General Comments General comments (skin integrity, edema, etc.): R hip wound vac in place.  Nursing cleared pt for participation in physical therapy.  Pt agreeable to PT session.  Pt's wife present during session.    Exercises Total Joint Exercises Ankle Circles/Pumps: AROM;Strengthening;Both;10 reps;Supine Quad Sets: AROM;Strengthening;Both;10 reps;Supine Gluteal Sets: AROM;Strengthening;Both;10 reps;Supine Heel Slides: AAROM;Strengthening;Right;10 reps;Supine Hip ABduction/ADduction: AAROM;Strengthening;Right;10 reps;Supine   Assessment/Plan    PT Assessment Patient needs continued PT services  PT Problem List Decreased strength;Decreased range of motion;Decreased activity tolerance;Decreased balance;Decreased mobility;Decreased knowledge of use of DME;Decreased knowledge of precautions;Pain;Decreased skin  integrity       PT Treatment Interventions DME instruction;Gait training;Stair training;Functional mobility training;Therapeutic activities;Therapeutic exercise;Balance training;Patient/family education    PT Goals (Current goals can be found in the Care Plan section)  Acute Rehab PT Goals Patient Stated Goal: to improve mobility and pain PT Goal Formulation: With patient/family Time For Goal Achievement: 12/09/20 Potential to Achieve Goals: Good    Frequency BID   Barriers to discharge Other (comment) Stairs    Co-evaluation               AM-PAC PT "6 Clicks" Mobility  Outcome Measure Help needed turning from your back to your side while in a flat bed without using bedrails?: A Little Help needed moving from lying on your back to sitting on the side of a flat bed without using bedrails?: A Lot Help needed moving to and from a bed to a chair (including a wheelchair)?: A Little Help needed standing up from a chair using your arms (e.g., wheelchair or bedside chair)?: A Little Help needed to walk in hospital room?: A Lot Help needed climbing 3-5 steps with a railing? : A Lot 6 Click Score: 15    End of Session Equipment Utilized During Treatment: Gait belt Activity Tolerance: Patient limited by pain Patient left: in chair;with call bell/phone within reach;with chair alarm set;with family/visitor present;with SCD's reapplied Nurse Communication: Mobility status;Precautions;Weight bearing status;Other (comment) (pt's pain status) PT Visit  Diagnosis: Other abnormalities of gait and mobility (R26.89);Muscle weakness (generalized) (M62.81);Difficulty in walking, not elsewhere classified (R26.2);Pain Pain - Right/Left: Right Pain - part of body: Hip    Time: 7841-2820 PT Time Calculation (min) (ACUTE ONLY): 48 min   Charges:   PT Evaluation $PT Eval Low Complexity: 1 Low PT Treatments $Therapeutic Exercise: 8-22 mins $Therapeutic Activity: 8-22 mins       Hendricks Limes, PT 11/25/20, 5:31 PM

## 2020-11-25 NOTE — Anesthesia Procedure Notes (Signed)
Procedure Name: Intubation Date/Time: 11/25/2020 12:32 PM Performed by: Irving Burton, CRNA Pre-anesthesia Checklist: Patient identified, Patient being monitored, Timeout performed, Emergency Drugs available and Suction available Patient Re-evaluated:Patient Re-evaluated prior to induction Oxygen Delivery Method: Circle system utilized Preoxygenation: Pre-oxygenation with 100% oxygen Induction Type: IV induction and Rapid sequence Laryngoscope Size: 3 and McGraph Grade View: Grade I Tube type: Oral Tube size: 7.0 mm Number of attempts: 1 Airway Equipment and Method: Stylet and Video-laryngoscopy Placement Confirmation: ETT inserted through vocal cords under direct vision, positive ETCO2 and breath sounds checked- equal and bilateral Secured at: 23 cm Tube secured with: Tape Dental Injury: Teeth and Oropharynx as per pre-operative assessment

## 2020-11-26 ENCOUNTER — Other Ambulatory Visit: Payer: Self-pay

## 2020-11-26 LAB — GLUCOSE, CAPILLARY
Glucose-Capillary: 200 mg/dL — ABNORMAL HIGH (ref 70–99)
Glucose-Capillary: 215 mg/dL — ABNORMAL HIGH (ref 70–99)
Glucose-Capillary: 252 mg/dL — ABNORMAL HIGH (ref 70–99)
Glucose-Capillary: 244 mg/dL — ABNORMAL HIGH (ref 70–99)

## 2020-11-26 LAB — BASIC METABOLIC PANEL
Anion gap: 8 (ref 5–15)
BUN: 17 mg/dL (ref 8–23)
CO2: 25 mmol/L (ref 22–32)
Calcium: 8.3 mg/dL — ABNORMAL LOW (ref 8.9–10.3)
Chloride: 103 mmol/L (ref 98–111)
Creatinine, Ser: 0.98 mg/dL (ref 0.61–1.24)
GFR, Estimated: >60 mL/min (ref >60)
Glucose, Bld: 228 mg/dL — ABNORMAL HIGH (ref 70–99)
Potassium: 4.4 mmol/L (ref 3.5–5.1)
Sodium: 136 mmol/L (ref 135–145)

## 2020-11-26 LAB — CBC
HCT: 35.9 % — ABNORMAL LOW (ref 39.0–52.0)
Hemoglobin: 12.4 g/dL — ABNORMAL LOW (ref 13.0–17.0)
MCH: 32 pg (ref 26.0–34.0)
MCHC: 34.5 g/dL (ref 30.0–36.0)
MCV: 92.5 fL (ref 80.0–100.0)
Platelets: 203 10*3/uL (ref 150–400)
RBC: 3.88 MIL/uL — ABNORMAL LOW (ref 4.22–5.81)
RDW: 12.3 % (ref 11.5–15.5)
WBC: 6.6 10*3/uL (ref 4.0–10.5)
nRBC: 0 % (ref 0.0–0.2)

## 2020-11-26 NOTE — TOC Initial Note (Signed)
Transition of Care [facility]) - Initial/Assessment Note    Patient Details  Name: Noah Orozco MRN: [id number] Date of Birth: 1960-10-05  Transition of [facility]) CM/SW Contact:    Candie Chroman, LCSW Phone Number: 11/26/2020, 4:18 PM  Clinical Narrative:  CSW met with patient. Wife and daughter at bedside. CSW introduced role and explained that PT recommendations would be discussed. Patient and family agreeable to SNF placement. Provided CMS scores for facilities within 25 miles of his zip code. Explained that with commercial insurance plan, may have daily copay. Patient has had 2 COVID vaccines but no booster. Made him aware he will likely have to quarantine for 14 days but most facilities are allowing visitors if they wear masks. No further concerns. CSW encouraged patient and his family to contact CSW as needed. CSW will continue to follow patient and his family to contact CSW as needed.                Expected Discharge Plan: Skilled Nursing Facility Barriers to Discharge: Continued Medical Work up   Patient Goals and CMS Choice   CMS Medicare.gov Compare Post Acute Care list provided to:: Patient (Wife and daughter at bedside)    Expected Discharge Plan and Services Expected Discharge Plan: [place] Acute Care Choice: [place] Living arrangements for the past 2 months: Single Family Home                                      Prior Living Arrangements/Services Living arrangements for the past 2 months: Single Family Home Lives with:: Spouse Patient language and need for interpreter reviewed:: Yes Do you feel safe going back to the place where you live?: Yes      Need for Family Participation in Patient Care: Yes (Comment) Care giver support system in place?: Yes (comment)   Criminal Activity/Legal Involvement Pertinent to Current Situation/Hospitalization: No - Comment as needed  Activities of Daily Living Home Assistive  Devices/Equipment: Cane (specify quad or straight) ADL Screening (condition at time of admission) Patient's cognitive ability adequate to safely complete daily activities?: Yes Is the patient deaf or have difficulty hearing?: No Does the patient have difficulty seeing, even when wearing glasses/contacts?: No Does the patient have difficulty concentrating, remembering, or making decisions?: No Patient able to express need for assistance with ADLs?: Yes Does the patient have difficulty dressing or bathing?: Yes Independently performs ADLs?: Yes (appropriate for developmental age) Does the patient have difficulty walking or climbing stairs?: Yes Weakness of Legs: Right Weakness of Arms/Hands: Both  Permission Sought/Granted Permission sought to share information with : Facility Sport and exercise psychologist, Family Supports Permission granted to share information with : Yes, Verbal Permission Granted  Share Information with NAME: Khyrin Trevathan  Permission granted to share info w AGENCY: SNF's  Permission granted to share info w Relationship: Wife  Permission granted to share info w Contact Information: [phone]  Emotional Assessment Appearance:: Appears stated age Attitude/Demeanor/Rapport: Engaged, Gracious Affect (typically observed): Accepting, Appropriate, Calm, Pleasant Orientation: : Oriented to Self, Oriented to Place, Oriented to  Time, Oriented to Situation Alcohol / Substance Use: Not Applicable Psych Involvement: No (comment)  Admission diagnosis:  S/P hip replacement [Z96.649] Patient Active Problem List   Diagnosis Date Noted   S/P hip replacement 11/25/2020   PCP:  Pcp, No Pharmacy:   [place] [place] -  [address]  [id number] Phone: [phone] Fax: [phone]     Social Determinants of Health (SDOH) Interventions    Readmission Risk Interventions No flowsheet data  found.

## 2020-11-26 NOTE — NC FL2 (Signed)
[place] MEDICAID FL2 LEVEL OF CARE SCREENING TOOL     IDENTIFICATION  Patient Name: Noah Orozco Birthdate: 1960/10/27 Sex: male Admission Date (Current Location): 11/25/2020  [facility] and [place] Number:  Chiropodist and Address:  [facility], [address] [place], [place] [address]      Provider Number: [id number]  Attending Physician Name and Address:  Kennedy Bucker, MD  Relative Name and Phone Number:       Current Level of Care: Hospital Recommended Level of Care: Skilled Nursing Facility Prior Approval Number:    Date Approved/Denied:   PASRR Number: [id number]  Discharge Plan: SNF    Current Diagnoses: Patient Active Problem List   Diagnosis Date Noted   S/P hip replacement 11/25/2020    Orientation RESPIRATION BLADDER Height & Weight     Self, Time, Situation, Place  Normal Continent Weight: 274 lb (124.3 kg) Height:  5' 8.5" (174 cm)  BEHAVIORAL SYMPTOMS/MOOD NEUROLOGICAL BOWEL NUTRITION STATUS   (None)  (None) Continent Diet (Carb modified)  AMBULATORY STATUS COMMUNICATION OF NEEDS Skin   Limited Assist Verbally Surgical wounds, Wound Vac (Incision on right hip (prevena wound vac).)                       Personal Care Assistance Level of Assistance  Bathing, Feeding, Dressing Bathing Assistance: Maximum assistance Feeding assistance: Limited assistance Dressing Assistance: Maximum assistance     Functional Limitations Info  Sight, Hearing, Speech Sight Info: Adequate Hearing Info: Adequate Speech Info: Adequate    SPECIAL CARE FACTORS FREQUENCY  PT (By licensed PT), OT (By licensed OT)     PT Frequency: 5 x week OT Frequency: 5 x week            Contractures Contractures Info: Not present    Additional Factors Info  Code Status, Allergies Code Status Info: Full code Allergies Info: NKDA           Current Medications (11/26/2020):  This is the current hospital active medication  list Current Facility-Administered Medications  Medication Dose Route Frequency Provider Last Rate Last Admin   0.9 %  sodium chloride infusion   Intravenous Continuous Yevette Edwards, MD   New Bag at 11/25/20 1127   0.9 %  sodium chloride infusion   Intravenous Continuous Kennedy Bucker, MD   Stopped at 11/25/20 2205   acetaminophen (TYLENOL) tablet 325-650 mg  325-650 mg Oral Q6H PRN Kennedy Bucker, MD       alum & mag hydroxide-simeth (MAALOX/MYLANTA) 200-200-20 MG/5ML suspension 30 mL  30 mL Oral Q4H PRN Kennedy Bucker, MD       bisacodyl (DULCOLAX) suppository 10 mg  10 mg Rectal Daily PRN Kennedy Bucker, MD       dapagliflozin propanediol (FARXIGA) tablet 10 mg  10 mg Oral Q1500 Kennedy Bucker, MD   10 mg at 11/26/20 7017   diphenhydrAMINE (BENADRYL) 12.5 MG/5ML elixir 12.5-25 mg  12.5-25 mg Oral Q4H PRN Kennedy Bucker, MD       docusate sodium (COLACE) capsule 100 mg  100 mg Oral BID Kennedy Bucker, MD   100 mg at 11/26/20 0803   enoxaparin (LOVENOX) injection 40 mg  40 mg Subcutaneous Q24H Kennedy Bucker, MD   40 mg at 11/26/20 0809   famotidine (PEPCID) tablet 20 mg  20 mg Oral Once Verlee Monte, NP       gabapentin (NEURONTIN) capsule 600 mg  600 mg Oral Q1500 Kennedy Bucker, MD  600 mg at 11/26/20 0803   glipiZIDE (GLUCOTROL) tablet 10 mg  10 mg Oral Q1500 Kennedy Bucker, MD   10 mg at 11/26/20 0803   HYDROmorphone (DILAUDID) injection 0.5-1 mg  0.5-1 mg Intravenous Q4H PRN Kennedy Bucker, MD   1 mg at 11/26/20 0431   hydrOXYzine (ATARAX/VISTARIL) tablet 75 mg  75 mg Oral Q1500 Kennedy Bucker, MD   75 mg at 11/26/20 0807   insulin aspart (novoLOG) injection 0-15 Units  0-15 Units Subcutaneous TID WC Kennedy Bucker, MD   8 Units at 11/26/20 1244   lisinopril (ZESTRIL) tablet 30 mg  30 mg Oral Q1500 Kennedy Bucker, MD   30 mg at 11/26/20 0803   magnesium citrate solution 1 Bottle  1 Bottle Oral Once PRN Kennedy Bucker, MD       menthol-cetylpyridinium (CEPACOL) lozenge 3 mg  1 lozenge Oral PRN Kennedy Bucker, MD       Or   phenol (CHLORASEPTIC) mouth spray 1 spray  1 spray Mouth/Throat PRN Kennedy Bucker, MD       metFORMIN (GLUCOPHAGE-XR) 24 hr tablet 500 mg  500 mg Oral Q1500 Kennedy Bucker, MD   500 mg at 11/26/20 2683   methocarbamol (ROBAXIN) tablet 500 mg  500 mg Oral Q6H PRN Kennedy Bucker, MD   500 mg at 11/26/20 1100   Or   methocarbamol (ROBAXIN) 500 mg in dextrose 5 % 50 mL IVPB  500 mg Intravenous Q6H PRN Kennedy Bucker, MD       ondansetron [facility]) tablet 4 mg  4 mg Oral Q6H PRN Kennedy Bucker, MD       Or   ondansetron [facility]) injection 4 mg  4 mg Intravenous Q6H PRN Kennedy Bucker, MD       oxyCODONE (Oxy IR/ROXICODONE) immediate release tablet 10-15 mg  10-15 mg Oral Q4H PRN Kennedy Bucker, MD   15 mg at 11/26/20 1459   oxyCODONE (Oxy IR/ROXICODONE) immediate release tablet 5-10 mg  5-10 mg Oral Q4H PRN Kennedy Bucker, MD       pantoprazole (PROTONIX) EC tablet 40 mg  40 mg Oral Daily Kennedy Bucker, MD   40 mg at 11/26/20 0803   polyethylene glycol (MIRALAX / GLYCOLAX) packet 17 g  17 g Oral Daily PRN Kennedy Bucker, MD       traMADol Janean Sark) tablet 50 mg  50 mg Oral Q6H Kennedy Bucker, MD   50 mg at 11/26/20 1239   zolpidem (AMBIEN) tablet 5 mg  5 mg Oral QHS PRN,MR X 1 Kennedy Bucker, MD         Discharge Medications: Please see discharge summary for a list of discharge medications.  Relevant Imaging Results:  Relevant Lab Results:   Additional Information SS#: [id number]. Has had 2 COVID vaccines. No booster. Moderna 03/26/20, 04/29/20.  Margarito Liner, LCSW

## 2020-11-26 NOTE — Progress Notes (Signed)
   Subjective: 1 Day Post-Op Procedure(s) (LRB): TOTAL HIP ARTHROPLASTY ANTERIOR APPROACH (Right) Patient reports pain as mild.   Patient is well, and has had no acute complaints or problems Denies any CP, SOB, ABD pain. We will continue therapy today.  Plan is to go Skilled nursing facility after hospital stay.  Objective: Vital signs in last 24 hours: Temp:  [96.8 F (36 C)-98.7 F (37.1 C)] 98.2 F (36.8 C) (06/10 0813) Pulse Rate:  [61-81] 70 (06/10 0813) Resp:  [12-22] 16 (06/10 0813) BP: (125-156)/(66-98) 143/79 (06/10 0813) SpO2:  [94 %-99 %] 99 % (06/10 0813) Weight:  [124.3 kg] 124.3 kg (06/09 1002)  Intake/Output from previous day: 06/09 0701 - 06/10 0700 In: 1319.4 [I.V.:1119.4; IV Piggyback:200] Out: 1475 [Urine:1275; Blood:200] Intake/Output this shift: No intake/output data recorded.  Recent Labs    11/25/20 1018 11/25/20 1529 11/26/20 0501  HGB 15.0 13.4 12.4*   Recent Labs    11/25/20 1529 11/26/20 0501  WBC 11.1* 6.6  RBC 4.20* 3.88*  HCT 39.3 35.9*  PLT 223 203   Recent Labs    11/25/20 1018 11/25/20 1529 11/26/20 0501  NA 136  --  136  K 4.2  --  4.4  CL 104  --  103  CO2  --   --  25  BUN 23  --  17  CREATININE 0.90 1.04 0.98  GLUCOSE 297*  --  228*  CALCIUM  --   --  8.3*   No results for input(s): LABPT, INR in the last 72 hours.  EXAM General - Patient is Alert, Appropriate, and Oriented Extremity - Neurovascular intact Sensation intact distally Intact pulses distally Dorsiflexion/Plantar flexion intact Dressing - dressing C/D/I and no drainage, prevena intact with out drainage Motor Function - intact, moving foot and toes well on exam.   Past Medical History:  Diagnosis Date   Anemia    Complication of anesthesia    woke up during colonoscopy    Diabetes mellitus without complication (HCC)    Diverticulitis large intestine    History of hiatal hernia    has been repaired   Hypertension    Pneumonia      Assessment/Plan:   1 Day Post-Op Procedure(s) (LRB): TOTAL HIP ARTHROPLASTY ANTERIOR APPROACH (Right) Active Problems:   S/P hip replacement  Estimated body mass index is 41.06 kg/m as calculated from the following:   Height as of this encounter: 5' 8.5" (1.74 m).   Weight as of this encounter: 124.3 kg. Advance diet Up with therapy Work on [employer] and VSS Pain controlled CM to assist with discharge to SNF  DVT Prophylaxis - Lovenox, TED hose, and SCDs Weight-Bearing as tolerated to right leg   T. Cranston Neighbor, PA-C [facility] 11/26/2020, 8:16 AM

## 2020-11-26 NOTE — Progress Notes (Addendum)
Physical Therapy Treatment Patient Details Name: Noah Orozco MRN: [id number] DOB: [birth year] Today's Date: 11/26/2020    History of Present Illness Pt is a 60 y.o. 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  PMH includes htn, hiatal hernia, DM, anemia, diverticulitis, PNA, colon surgery.    PT Comments    Pt was long sitting in bed upon arriving. Endorses pain but was willing to participate in OOB activity. Pt is A and O x 4. Verbalized several times throughout session that his house is not safe for him to return to at DC due to dogs and amount of stairs. He was able to exit bed, stand, and ambulate 15 ft with slow antalgic step to pattern. Fatigue/pain limiting. Issued HEP and discussed importance of performing throughout the day. Will readdress in PM session. At conclusion of session, pt was in recliner with call bell in reach, chair alarm in place and RN aware of pt's abilities.   Late entry PM session: PM session focused on there ex and family education. Pt was able to ambulate 25 ft with RW with CGA and perform there ex handout with AAROM for only heel slides, SLR, and hip abduction. Pt wanting SNF at DC due to inability to manage at home. Acute PT will continue to progress as able per POC.    Follow Up Recommendations  SNF     Equipment Recommendations  Rolling walker with 5" wheels;3in1 (PT)       Precautions / Restrictions Precautions Precautions: Anterior Hip;Fall Precaution Booklet Issued: Yes (comment) Precaution Comments: wound vac Restrictions Weight Bearing Restrictions: Yes RLE Weight Bearing: Weight bearing as tolerated    Mobility  Bed Mobility Overal bed mobility: Needs Assistance Bed Mobility: Supine to Sit     Supine to sit: Min assist;HOB elevated     General bed mobility comments: Pt  required min assist to exit L side of bed. Increased time to perform. pain limiting    Transfers Overall transfer level: Needs assistance Equipment  used: Rolling walker (2 wheeled) Transfers: Sit to/from Stand Sit to Stand: Min assist         General transfer comment: Min assist to stand form lowest bed height with VCs for handplacement, fwd wt shift, and overall technique imporvements  Ambulation/Gait Ambulation/Gait assistance: Min guard Gait Distance (Feet): 15 Feet Assistive device: Rolling walker (2 wheeled) Gait Pattern/deviations: Antalgic;Step-to pattern Gait velocity: decreased   General Gait Details: Pain limited. Pt was able to ambulate 15 ft to recliner. no LOB noted       Balance Overall balance assessment: Needs assistance Sitting-balance support: No upper extremity supported;Feet supported Sitting balance-Leahy Scale: Good Sitting balance - Comments: steady sitting reaching outside BOS   Standing balance support: Bilateral upper extremity supported;During functional activity Standing balance-Leahy Scale: Good Standing balance comment: no LOB noted in standing      Cognition Arousal/Alertness: Awake/alert Behavior During Therapy: WFL for tasks assessed/performed Overall Cognitive Status: Within Functional Limits for tasks assessed      General Comments: Pt is A and O x 4 and motivated to imporve             Pertinent Vitals/Pain Pain Assessment: 0-10 Pain Score: 8  Pain Location: R hip Pain Descriptors / Indicators: Aching;Sore Pain Intervention(s): Limited activity within patient's tolerance;Premedicated before session;Monitored during session;Repositioned;Ice applied     PT Goals (current goals can now be found in the care plan section) Acute Rehab PT Goals Patient Stated Goal: to improve mobility and pain  Progress towards PT goals: Progressing toward goals    Frequency    BID      PT Plan Current plan remains appropriate       AM-PAC PT "6 Clicks" Mobility   Outcome Measure  Help needed turning from your back to your side while in a flat bed without using bedrails?: A  Little Help needed moving from lying on your back to sitting on the side of a flat bed without using bedrails?: A Little Help needed moving to and from a bed to a chair (including a wheelchair)?: A Little Help needed standing up from a chair using your arms (e.g., wheelchair or bedside chair)?: A Little Help needed to walk in hospital room?: A Little Help needed climbing 3-5 steps with a railing? : A Lot 6 Click Score: 17    End of Session Equipment Utilized During Treatment: Gait belt Activity Tolerance: Patient limited by pain Patient left: in chair;with call bell/phone within reach;with chair alarm set;with family/visitor present;with SCD's reapplied Nurse Communication: Mobility status;Precautions;Weight bearing status;Other (comment) PT Visit Diagnosis: Other abnormalities of gait and mobility (R26.89);Muscle weakness (generalized) (M62.81);Difficulty in walking, not elsewhere classified (R26.2);Pain Pain - Right/Left: Right Pain - part of body: Hip     Time: 4403-4742 PT Time Calculation (min) (ACUTE ONLY): 26 min  Charges:  $Gait Training: 8-22 mins $Therapeutic Activity: 8-22 mins                     Jetta Lout PTA 11/26/20, 1:59 PM

## 2020-11-26 NOTE — Evaluation (Signed)
Occupational Therapy Evaluation Patient Details Name: Noah Orozco MRN: [id number] DOB: 1960-07-28 Today's Date: 11/26/2020    History of Present Illness Pt is a 60 y.o. 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  PMH includes htn, hiatal hernia, DM, anemia, diverticulitis, PNA, colon surgery.   Clinical Impression   Noah Orozco was seen for OT evaluation this date. Prior to hospital admission, pt was MOD I for mobility and ADLs. Pt lives with wife in split level home c 5 STE and 7 steps to bed/bath. Pt presents to acute OT demonstrating impaired ADL performance and functional mobility 2/2 decreased activity tolerance and functional strength/ROM/balance deficits. Pt currently requires MAX A for LBD seated EOC. MIN A + RW for ADL t/f - assist for eccentric control and sequencing. Requires single UE support for funcitonal reach outside BOS, noted grimacing with RUE reaching. Pt would benefit from skilled OT to address noted impairments and functional limitations (see below for any additional details) in order to maximize safety and independence while minimizing falls risk and caregiver burden. Upon hospital discharge, recommend STR to maximize pt safety and return to PLOF. Pt will require a Bariatric 3-in-1 commode 2/2 his body habitus. A regular commode will impinge his surgical site and will not safely address his needs.      Follow Up Recommendations  SNF    Equipment Recommendations  3 in 1 bedside commode (Bariatric)    Recommendations for Other Services       Precautions / Restrictions Precautions Precautions: Anterior Hip;Fall Precaution Booklet Issued: No Precaution Comments: wound vac Restrictions Weight Bearing Restrictions: Yes RLE Weight Bearing: Weight bearing as tolerated      Mobility Bed Mobility Overal bed mobility: Needs Assistance Bed Mobility: Supine to Sit     Supine to sit: Min assist;HOB elevated     General bed mobility comments: pt  received and left in chair    Transfers Overall transfer level: Needs assistance Equipment used: Rolling walker (2 wheeled) Transfers: Sit to/from Stand Sit to Stand: Min assist         General transfer comment: poor eccentric control    Balance Overall balance assessment: Needs assistance Sitting-balance support: No upper extremity supported;Feet supported Sitting balance-Leahy Scale: Good Sitting balance - Comments: steady sitting reaching outside BOS   Standing balance support: Single extremity supported;During functional activity Standing balance-Leahy Scale: Good Standing balance comment: no LOB noted in standing                           ADL either performed or assessed with clinical judgement   ADL Overall ADL's : Needs assistance/impaired                                       General ADL Comments: MAX A for LBD seated EOC. MIN A + RW for ADL t/f - assist for eccentric control and sequencing. Requires single UE support for funcitonal reach outside BOS, noted grimacing with RUE reaching      Pertinent Vitals/Pain Pain Assessment: 0-10 Pain Score: 8  Pain Location: R hip Pain Descriptors / Indicators: Aching;Sore Pain Intervention(s): Limited activity within patient's tolerance;Repositioned     Hand Dominance Right   Extremity/Trunk Assessment Upper Extremity Assessment Upper Extremity Assessment: Overall WFL for tasks assessed   Lower Extremity Assessment Lower Extremity Assessment: Generalized weakness  Communication Communication Communication: No difficulties   Cognition Arousal/Alertness: Awake/alert Behavior During Therapy: WFL for tasks assessed/performed Overall Cognitive Status: Within Functional Limits for tasks assessed                                 General Comments: A&O x4, good insight into deficits   General Comments       Exercises Exercises: Other exercises Other Exercises Other  Exercises: Pt educated re;O T role, DME recs, d/c recs, falls prevention, pain mgmt Other Exercises: LBD, sit<>stand, sitting/standing balance/tolerance   Shoulder Instructions      Home Living Family/patient expects to be discharged to:: Skilled nursing facility Living Arrangements: Spouse/significant other Available Help at Discharge: Family Type of Home: House Home Access: Stairs to enter [employer] of Steps: 5 STE B railing (unable to reach both) Entrance Stairs-Rails: Right;Left Home Layout: Multi-level Alternate Level Stairs-Number of Steps: split level home; 7 steps with R rail to bedroom and bathroom Alternate Level Stairs-Rails: Right Bathroom Shower/Tub: Producer, television/film/video: Standard     Home Equipment: Environmental consultant - 4 wheels;Cane - single point          Prior Functioning/Environment Level of Independence: Independent with assistive device(s)        Comments: Ambulatory with SPC short distances and rollator longer distances for last 18 months; pt reports no falls in past 6 months        OT Problem List: Decreased strength;Decreased range of motion;Decreased activity tolerance;Impaired balance (sitting and/or standing);Decreased knowledge of use of DME or AE      OT Treatment/Interventions: Self-care/ADL training;Therapeutic exercise;Energy conservation;DME and/or AE instruction;Therapeutic activities;Balance training;Patient/family education    OT Goals(Current goals can be found in the care plan section) Acute Rehab OT Goals Patient Stated Goal: to improve mobility and pain OT Goal Formulation: With patient Time For Goal Achievement: 12/10/20 Potential to Achieve Goals: Good ADL Goals Pt Will Perform Grooming: with modified independence;standing (c LRAD PRN pt will tolerate >10 mins standing grooming task) Pt Will Perform Lower Body Dressing: with set-up;sit to/from stand (c LRAD PRN) Pt Will Transfer to Toilet: with modified  independence;ambulating;regular height toilet (c LRAD PRN)  OT Frequency: Min 1X/week   Barriers to D/C: Inaccessible home environment             AM-PAC OT "6 Clicks" Daily Activity     Outcome Measure Help from another person eating meals?: None Help from another person taking care of personal grooming?: A Little Help from another person toileting, which includes using toliet, bedpan, or urinal?: A Little Help from another person bathing (including washing, rinsing, drying)?: A Lot Help from another person to put on and taking off regular upper body clothing?: None Help from another person to put on and taking off regular lower body clothing?: A Lot 6 Click Score: 18   End of Session Equipment Utilized During Treatment: Rolling walker Nurse Communication: Mobility status  Activity Tolerance: Patient tolerated treatment well Patient left: in chair;with chair alarm set  OT Visit Diagnosis: Unsteadiness on feet (R26.81)                Time: 4665-9935 OT Time Calculation (min): 10 min Charges:  OT General Charges $OT Visit: 1 Visit OT Evaluation $OT Eval Low Complexity: 1 Low  Kathie Dike, M.S. OTR/L  11/26/20, 2:24 PM  ascom [phone]

## 2020-11-27 LAB — GLUCOSE, CAPILLARY
Glucose-Capillary: 207 mg/dL — ABNORMAL HIGH (ref 70–99)
Glucose-Capillary: 163 mg/dL — ABNORMAL HIGH (ref 70–99)
Glucose-Capillary: 136 mg/dL — ABNORMAL HIGH (ref 70–99)
Glucose-Capillary: 225 mg/dL — ABNORMAL HIGH (ref 70–99)

## 2020-11-27 LAB — CBC
HCT: 35.9 % — ABNORMAL LOW (ref 39.0–52.0)
Hemoglobin: 12.3 g/dL — ABNORMAL LOW (ref 13.0–17.0)
MCH: 32.3 pg (ref 26.0–34.0)
MCHC: 34.3 g/dL (ref 30.0–36.0)
MCV: 94.2 fL (ref 80.0–100.0)
Platelets: 214 10*3/uL (ref 150–400)
RBC: 3.81 MIL/uL — ABNORMAL LOW (ref 4.22–5.81)
RDW: 12.1 % (ref 11.5–15.5)
WBC: 9.1 10*3/uL (ref 4.0–10.5)
nRBC: 0 % (ref 0.0–0.2)

## 2020-11-27 MED ORDER — TRAMADOL HCL 50 MG PO TABS: 50.0000 mg | ORAL_TABLET | Freq: Four times a day (QID) | ORAL | 0 refills | Status: AC | PRN

## 2020-11-27 MED ORDER — METHOCARBAMOL 500 MG PO TABS: 500.0000 mg | ORAL_TABLET | Freq: Four times a day (QID) | ORAL | 0 refills | Status: AC | PRN

## 2020-11-27 MED ORDER — ENOXAPARIN SODIUM 40 MG/0.4ML IJ SOSY: 40.0000 mg | PREFILLED_SYRINGE | INTRAMUSCULAR | 0 refills | Status: DC

## 2020-11-27 MED ORDER — OXYCODONE HCL 5 MG PO TABS: 5.0000 mg | ORAL_TABLET | ORAL | 0 refills | Status: AC | PRN

## 2020-11-27 MED ORDER — DOCUSATE SODIUM 100 MG PO CAPS: 100.0000 mg | ORAL_CAPSULE | Freq: Two times a day (BID) | ORAL | 0 refills | Status: AC

## 2020-11-27 NOTE — TOC Progression Note (Addendum)
Transition of Care [facility]) - Progression Note    Patient Details  Name: Noah Orozco MRN: [id number] Date of Birth: 04/21/1961  [facility]) CM/SW Contact  Margarito Liner, LCSW Phone Number: 11/27/2020, 8:24 AM  Clinical Narrative:   No bed offers this morning.  3:22 pm: Preferences are [facility], [facility] in [place], and a facility in [place] but patient could not remember name. Left voicemail for April at [facility] asking her to review referral. Left voicemail at [facility] to see if they accept outside rehab referrals.   Expected Discharge Plan: Skilled Nursing Facility Barriers to Discharge: Continued Medical Work up  Expected Discharge Plan and Services Expected Discharge Plan: Skilled Nursing Facility     Post Acute Care Choice: Skilled Nursing Facility Living arrangements for the past 2 months: Single Family Home                                       Social Determinants of Health (SDOH) Interventions    Readmission Risk Interventions No flowsheet data found.

## 2020-11-27 NOTE — Progress Notes (Signed)
Physical Therapy Treatment Patient Details Name: Noah Orozco MRN: [id number] DOB: 25-Feb-1961 Today's Date: 11/27/2020    History of Present Illness Pt is a 60 y.o. 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  PMH includes htn, hiatal hernia, DM, anemia, diverticulitis, PNA, colon surgery.    PT Comments     Pt was long sitting in bed upon arriving. He endorses severe 8/10 pain but was willing to participate. Reports 10/10 pain with movement however was able to perform all desired task. Increased time required to perform all mobility/transfer/ and gait. Pt did not require extensive assistance. Does require min assist to exit bed but once seated EOB is able to stand and ambulate with RW while adhering to precautions with supervision. " I definitely still need rehab." Pt unwilling to ambulate > 30 ft." I will try further later." Acute PT will continue to follow and progress as able per POC.    Follow Up Recommendations  SNF     Equipment Recommendations  Rolling walker with 5" wheels;3in1 (PT)       Precautions / Restrictions Precautions Precautions: Anterior Hip;Fall Precaution Booklet Issued: Yes (comment) Precaution Comments: wound vac Restrictions Weight Bearing Restrictions: Yes RLE Weight Bearing: Weight bearing as tolerated    Mobility  Bed Mobility Overal bed mobility: Needs Assistance Bed Mobility: Supine to Sit     Supine to sit: HOB elevated;Min assist     General bed mobility comments: min assist to exit bed. Slow due to pain. Heavy use of bed rail and HOB elevated.    Transfers Overall transfer level: Needs assistance Equipment used: Rolling walker (2 wheeled) Transfers: Sit to/from Stand Sit to Stand: Supervision         General transfer comment: Pt was able to stand to RW from lowest bed height without physical assistance. increased time due to pain  Ambulation/Gait Ambulation/Gait assistance: Supervision Gait Distance (Feet): 50  Feet Assistive device: Rolling walker (2 wheeled) Gait Pattern/deviations: Antalgic;Step-to pattern Gait velocity: decreased   General Gait Details: Pain limited. Pt was able to ambulate ~ 30 ft todoorway of room and back to recliner. no LOB noted. UNwilling to go further distances 2/2 to pain      Balance Overall balance assessment: Needs assistance Sitting-balance support: No upper extremity supported;Feet supported Sitting balance-Leahy Scale: Good     Standing balance support: Bilateral upper extremity supported;During functional activity Standing balance-Leahy Scale: Good Standing balance comment: no LOB noted in standing or during gait with RW      Cognition Arousal/Alertness: Awake/alert Behavior During Therapy: WFL for tasks assessed/performed Overall Cognitive Status: Within Functional Limits for tasks assessed        General Comments: A&O x4, good insight into deficits         General Comments General comments (skin integrity, edema, etc.): reviewed hip precautions and ther ex handout. pt requested waiting till PM session to perform.      Pertinent Vitals/Pain Pain Assessment: 0-10 Pain Score: 8  Pain Location: R hip Pain Descriptors / Indicators: Aching;Sore Pain Intervention(s): Limited activity within patient's tolerance;Monitored during session;Premedicated before session;Repositioned;Ice applied     PT Goals (current goals can now be found in the care plan section) Acute Rehab PT Goals Patient Stated Goal: to improve mobility and pain Progress towards PT goals: Progressing toward goals    Frequency    BID      PT Plan Current plan remains appropriate       AM-PAC PT "6 Clicks" Mobility  Outcome Measure  Help needed turning from your back to your side while in a flat bed without using bedrails?: A Little Help needed moving from lying on your back to sitting on the side of a flat bed without using bedrails?: A Little Help needed moving to  and from a bed to a chair (including a wheelchair)?: A Little Help needed standing up from a chair using your arms (e.g., wheelchair or bedside chair)?: A Little Help needed to walk in hospital room?: A Little Help needed climbing 3-5 steps with a railing? : A Lot 6 Click Score: 17    End of Session Equipment Utilized During Treatment: Gait belt Activity Tolerance: Patient limited by pain Patient left: in chair;with call bell/phone within reach;with chair alarm set;with family/visitor present;with SCD's reapplied Nurse Communication: Mobility status;Precautions;Weight bearing status;Other (comment) PT Visit Diagnosis: Other abnormalities of gait and mobility (R26.89);Muscle weakness (generalized) (M62.81);Difficulty in walking, not elsewhere classified (R26.2);Pain Pain - Right/Left: Right Pain - part of body: Hip     Time: 8828-0034 PT Time Calculation (min) (ACUTE ONLY): 19 min  Charges:  $Gait Training: 8-22 mins                     Jetta Lout PTA 11/27/20, 10:25 AM

## 2020-11-27 NOTE — Discharge Summary (Addendum)
Physician Discharge Summary  Patient ID: Noah Orozco MRN: [id number] DOB/AGE: 1961/01/08 60 y.o.  Admit date: 11/25/2020 Discharge date: 12/04/2020  Admission Diagnoses:  S/P hip replacement [Z96.649]   Discharge Diagnoses: Patient Active Problem List   Diagnosis Date Noted   S/P hip replacement 11/25/2020    Past Medical History:  Diagnosis Date   Anemia    Complication of anesthesia    woke up during colonoscopy    Diabetes mellitus without complication (HCC)    Diverticulitis large intestine    History of hiatal hernia    has been repaired   Hypertension    Pneumonia      Transfusion: none   Consultants (if any):   Discharged Condition: Improved  Hospital Course: Noah Orozco is an 60 y.o. male who was admitted 11/25/2020 with a diagnosis of right hip osteoarthritis and went to the operating room on 11/25/2020 and underwent the above named procedures.    Surgeries: Procedure(s): TOTAL HIP ARTHROPLASTY ANTERIOR APPROACH on 11/25/2020 Patient tolerated the surgery well. Taken to PACU where she was stabilized and then transferred to the orthopedic floor.  Started on Lovenox 40 mg q 24 hrs. Foot pumps applied bilaterally at 80 mm. Heels elevated on bed with rolled towels. No evidence of DVT. Negative Homan. Physical therapy started on day #1 for gait training and transfer. OT started day #1 for ADL and assisted devices.  Patient's foley was d/c on day #1. Patient's IV was d/c on day #2.  On post op day #4 patient was stable and ready for discharge to SNF. Patient tested positive for covid for routine SNF screening test. Patient with low grade temp, no other symptoms. SNF unable to accept patient until 12/09/2020.  Post op day 5-8, patient made progress with PT. Unable to find home health agency willing to accept patient. Post op day 9, patient stable and ready for discharge to home.  Implants: Medacta Massterloc 7 lateralized stem with 54 mm Mpact DM cup and liner, metal S 28 mm  head    He was given perioperative antibiotics:  Anti-infectives (From admission, onward)    Start     Dose/Rate Route Frequency Ordered Stop   11/25/20 1600  ceFAZolin (ANCEF) IVPB 2g/100 mL premix        2 g 200 mL/hr over 30 Minutes Intravenous Every 6 hours 11/25/20 1515 11/25/20 2204   11/25/20 0600  ceFAZolin (ANCEF) IVPB 3g/100 mL premix        3 g 200 mL/hr over 30 Minutes Intravenous On call to O.R. 11/25/20 0242 11/25/20 1152     .  He was given sequential compression devices, early ambulation, and Lovenox TEDs for DVT prophylaxis.  He benefited maximally from the hospital stay and there were no complications.    Recent vital signs:  Vitals:   12/04/20 0349 12/04/20 0932  BP: 109/66 115/70  Pulse: 65 73  Resp: 18 18  Temp: 97.9 F (36.6 C) 98.2 F (36.8 C)  SpO2: 99% 99%    Recent laboratory studies:  Lab Results  Component Value Date   HGB 12.3 (L) 11/27/2020   HGB 12.4 (L) 11/26/2020   HGB 13.4 11/25/2020   Lab Results  Component Value Date   WBC 9.1 11/27/2020   PLT 214 11/27/2020   No results found for: INR Lab Results  Component Value Date   NA 136 11/26/2020   K 4.4 11/26/2020   CL 103 11/26/2020   CO2 25 11/26/2020   BUN 17 11/26/2020  CREATININE 1.40 (H) 12/02/2020   GLUCOSE 228 (H) 11/26/2020    Discharge Medications:   Allergies as of 12/04/2020   No Known Allergies      Medication List     TAKE these medications    docusate sodium 100 MG capsule Commonly known as: COLACE Take 1 capsule (100 mg total) by mouth 2 (two) times daily.   enoxaparin 40 MG/0.4ML injection Commonly known as: LOVENOX Inject 0.4 mLs (40 mg total) into the skin daily for 14 days.   Farxiga 10 MG Tabs tablet Generic drug: dapagliflozin propanediol Take 10 mg by mouth daily in the afternoon.   gabapentin 300 MG capsule Commonly known as: NEURONTIN Take 600 mg by mouth daily in the afternoon.   glipiZIDE 10 MG tablet Commonly known as:  GLUCOTROL Take 10 mg by mouth daily in the afternoon.   hydrOXYzine 25 MG tablet Commonly known as: ATARAX/VISTARIL Take 75 mg by mouth daily in the afternoon.   lisinopril 30 MG tablet Commonly known as: ZESTRIL Take 30 mg by mouth daily in the afternoon.   metFORMIN 500 MG 24 hr tablet Commonly known as: GLUCOPHAGE-XR Take 500 mg by mouth daily in the afternoon.   methocarbamol 500 MG tablet Commonly known as: ROBAXIN Take 1 tablet (500 mg total) by mouth every 6 (six) hours as needed for muscle spasms.   oxyCODONE 5 MG immediate release tablet Commonly known as: Oxy IR/ROXICODONE Take 1-2 tablets (5-10 mg total) by mouth every 4 (four) hours as needed for moderate pain (pain score 4-6).   Rybelsus 14 MG Tabs Generic drug: Semaglutide Take 14 mg by mouth in the morning.   traMADol 50 MG tablet Commonly known as: ULTRAM Take 1 tablet (50 mg total) by mouth every 6 (six) hours as needed. What changed:  when to take this reasons to take this               Durable Medical Equipment  (From admission, onward)           Start     Ordered   11/25/20 1515  DME 3 n 1  Once        11/25/20 1514   11/25/20 1515  DME Walker rolling  Once       Question Answer Comment  Walker: With 5 Inch Wheels   Patient needs a walker to treat with the following condition S/P hip replacement      11/25/20 1514   11/25/20 1515  DME Bedside commode  Once       Question:  Patient needs a bedside commode to treat with the following condition  Answer:  S/P hip replacement   11/25/20 1514            Diagnostic Studies: DG HIP OPERATIVE UNILAT W OR W/O PELVIS RIGHT  Result Date: 11/25/2020 CLINICAL DATA:  Arthroplasty EXAM: OPERATIVE RIGHT HIP (WITH PELVIS IF PERFORMED)  VIEWS TECHNIQUE: Fluoroscopic spot image(s) were submitted for interpretation post-operatively. COMPARISON:  None. FINDINGS: Several spot intraoperative views of the RIGHT hip. Patient status post RIGHT total hip  arthroplasty. No complicating features IMPRESSION: RIGHT hip total arthroplasty. Electronically Signed   By: Genevive Bi M.D.   On: 11/25/2020 16:01   DG HIP UNILAT W OR W/O PELVIS 2-3 VIEWS RIGHT  Result Date: 11/25/2020 CLINICAL DATA:  Status post right hip replacement. EXAM: DG HIP (WITH OR WITHOUT PELVIS) 2-3V RIGHT COMPARISON:  None. FINDINGS: Right hip prosthesis appears to be well situated. Expected postoperative changes are  seen in the surrounding soft tissues. IMPRESSION: Status post right hip arthroplasty. Electronically Signed   By: Lupita Raider M.D.   On: 11/25/2020 14:31    Disposition: Discharge disposition: 03-Skilled Nursing Facility    home     Follow-up Information     Evon Slack, PA-C Follow up in 2 week(s).   Specialties: Orthopedic Surgery, Emergency Medicine Contact information: [address] [place] [address] [phone]         Evon Slack, PA-C Follow up on 12/09/2020.   Specialties: Orthopedic Surgery, Emergency Medicine Why: AT [id number] Contact information: [address] [place] [address] [phone]                  Signed: Madelyn Flavors 12/04/2020, 10:33 AM

## 2020-11-27 NOTE — Progress Notes (Signed)
Physical Therapy Treatment Patient Details Name: Noah Orozco MRN: [id number] DOB: Feb 29, 1961 Today's Date: 11/27/2020    History of Present Illness Pt is a 60 y.o. 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  PMH includes htn, hiatal hernia, DM, anemia, diverticulitis, PNA, colon surgery.    PT Comments    Pt was sitting in recliner upon arriving. Endorses 9/10 pain. RN arrived and issued pain meds during session. Even with 9/10 pain, pt agrees to session. Easily able to stand and ambulate with RW 50 ft however unwilling to go further distances. Performed HEP once returned to recliner with minimal assistance. Overall continues to demonstrate improving mobility and safety with decreased assistance. Correctly able to state and adhere to hip precautions without cues. Pt continues to not feel safe enough to return home. Will continue to recommend DC to SNF    Follow Up Recommendations  SNF     Equipment Recommendations  Rolling walker with 5" wheels;3in1 (PT)       Precautions / Restrictions Precautions Precautions: Anterior Hip;Fall Precaution Booklet Issued: Yes (comment) Precaution Comments: wound vac Restrictions Weight Bearing Restrictions: Yes RLE Weight Bearing: Weight bearing as tolerated    Mobility  Bed Mobility Overal bed mobility: Needs Assistance Bed Mobility: Supine to Sit     Supine to sit: HOB elevated;Min assist     General bed mobility comments: Pt was in recliner pre/psot session    Transfers Overall transfer level: Modified independent Equipment used: Rolling walker (2 wheeled) Transfers: Sit to/from Stand Sit to Stand: Supervision         General transfer comment: Pt was able to stand to RW from lowest bed height without physical assistance. increased time due to pain  Ambulation/Gait Ambulation/Gait assistance: Supervision Gait Distance (Feet): 50 Feet Assistive device: Rolling walker (2 wheeled) Gait Pattern/deviations:  Antalgic;Step-to pattern Gait velocity: decreased   General Gait Details: pt was able to ambulate from recliner to/from doorway of room 2 x ~ 50 ft with slow antalgic step to pattern. no LOB or safety concern during gait. distance limited by pain   Stairs Stairs:  (unwilling to try)         Balance Overall balance assessment: Modified Independent Sitting-balance support: No upper extremity supported;Feet supported Sitting balance-Leahy Scale: Good     Standing balance support: Bilateral upper extremity supported;During functional activity Standing balance-Leahy Scale: Good Standing balance comment: no LOB noted in standing or during gait with RW                            Cognition Arousal/Alertness: Awake/alert Behavior During Therapy: WFL for tasks assessed/performed Overall Cognitive Status: Within Functional Limits for tasks assessed          General Comments: A&O x4 Eager to DC to rehab      Exercises Total Joint Exercises Ankle Circles/Pumps: AROM;Strengthening;Both;10 reps;Supine Quad Sets: AROM;Strengthening;Both;10 reps;Supine Gluteal Sets: AROM;Strengthening;Both;10 reps;Supine Heel Slides: AAROM;10 reps Hip ABduction/ADduction: AAROM;10 reps Straight Leg Raises: AAROM;5 reps    General Comments General comments (skin integrity, edema, etc.): reviewed hip precautions and ther ex handout. pt requested waiting till PM session to perform.      Pertinent Vitals/Pain Pain Assessment: 0-10 Pain Score: 9  Pain Location: R hip Pain Descriptors / Indicators: Aching;Sore Pain Intervention(s): RN gave pain meds during session;Premedicated before session;Monitored during session;Limited activity within patient's tolerance     PT Goals (current goals can now be found in the care plan  section) Acute Rehab PT Goals Patient Stated Goal: rehab then home Progress towards PT goals: Progressing toward goals    Frequency    BID      PT Plan Current  plan remains appropriate       AM-PAC PT "6 Clicks" Mobility   Outcome Measure  Help needed turning from your back to your side while in a flat bed without using bedrails?: A Little Help needed moving from lying on your back to sitting on the side of a flat bed without using bedrails?: A Little Help needed moving to and from a bed to a chair (including a wheelchair)?: A Little Help needed standing up from a chair using your arms (e.g., wheelchair or bedside chair)?: A Little Help needed to walk in hospital room?: A Little Help needed climbing 3-5 steps with a railing? : A Little 6 Click Score: 18    End of Session Equipment Utilized During Treatment: Gait belt Activity Tolerance: Patient limited by pain Patient left: in chair;with call bell/phone within reach;with chair alarm set;with family/visitor present;with SCD's reapplied Nurse Communication: Mobility status;Precautions;Weight bearing status;Other (comment) PT Visit Diagnosis: Other abnormalities of gait and mobility (R26.89);Muscle weakness (generalized) (M62.81);Difficulty in walking, not elsewhere classified (R26.2);Pain Pain - Right/Left: Right Pain - part of body: Hip     Time: 1333-1350 PT Time Calculation (min) (ACUTE ONLY): 17 min  Charges:  $Gait Training: 8-22 mins $Therapeutic Activity: 8-22 mins                     Jetta Lout PTA 11/27/20, 2:10 PM

## 2020-11-27 NOTE — Progress Notes (Signed)
   Subjective: 2 Days Post-Op Procedure(s) (LRB): TOTAL HIP ARTHROPLASTY ANTERIOR APPROACH (Right) Patient reports pain as mild.   Patient is well, and has had no acute complaints or problems Denies any CP, SOB, ABD pain. We will continue therapy today.  Plan is to go Skilled nursing facility after hospital stay.  Objective: Vital signs in last 24 hours: Temp:  [98.2 F (36.8 C)-99.3 F (37.4 C)] 99.1 F (37.3 C) (06/11 0755) Pulse Rate:  [71-93] 93 (06/11 0755) Resp:  [15-20] 20 (06/11 0755) BP: (115-150)/(74-79) 150/76 (06/11 0755) SpO2:  [99 %-100 %] 100 % (06/11 0755)  Intake/Output from previous day: 06/10 0701 - 06/11 0700 In: 240 [P.O.:240] Out: 1100 [Urine:1100] Intake/Output this shift: Total I/O In: -  Out: 250 [Urine:250]  Recent Labs    11/25/20 1018 11/25/20 1529 11/26/20 0501 11/27/20 0519  HGB 15.0 13.4 12.4* 12.3*   Recent Labs    11/26/20 0501 11/27/20 0519  WBC 6.6 9.1  RBC 3.88* 3.81*  HCT 35.9* 35.9*  PLT 203 214   Recent Labs    11/25/20 1018 11/25/20 1529 11/26/20 0501  NA 136  --  136  K 4.2  --  4.4  CL 104  --  103  CO2  --   --  25  BUN 23  --  17  CREATININE 0.90 1.04 0.98  GLUCOSE 297*  --  228*  CALCIUM  --   --  8.3*   No results for input(s): LABPT, INR in the last 72 hours.  EXAM General - Patient is Alert, Appropriate, and Oriented Extremity - Neurovascular intact Sensation intact distally Intact pulses distally Dorsiflexion/Plantar flexion intact Dressing - dressing C/D/I and no drainage, prevena intact with out drainage Motor Function - intact, moving foot and toes well on exam.   Past Medical History:  Diagnosis Date   Anemia    Complication of anesthesia    woke up during colonoscopy    Diabetes mellitus without complication (HCC)    Diverticulitis large intestine    History of hiatal hernia    has been repaired   Hypertension    Pneumonia     Assessment/Plan:   2 Days Post-Op Procedure(s)  (LRB): TOTAL HIP ARTHROPLASTY ANTERIOR APPROACH (Right) Active Problems:   S/P hip replacement  Estimated body mass index is 41.06 kg/m as calculated from the following:   Height as of this encounter: 5' 8.5" (1.74 m).   Weight as of this encounter: 124.3 kg. Advance diet Up with therapy Work on [employer] and VSS Pain controlled CM to assist with discharge to SNF  DVT Prophylaxis - Lovenox, TED hose, and SCDs Weight-Bearing as tolerated to right leg   T. Cranston Neighbor, PA-C [facility] 11/27/2020, 8:54 AM

## 2020-11-27 NOTE — Discharge Instructions (Signed)

## 2020-11-28 LAB — GLUCOSE, CAPILLARY
Glucose-Capillary: 171 mg/dL — ABNORMAL HIGH (ref 70–99)
Glucose-Capillary: 201 mg/dL — ABNORMAL HIGH (ref 70–99)
Glucose-Capillary: 141 mg/dL — ABNORMAL HIGH (ref 70–99)
Glucose-Capillary: 147 mg/dL — ABNORMAL HIGH (ref 70–99)

## 2020-11-28 LAB — URINALYSIS, COMPLETE (UACMP) WITH MICROSCOPIC
Bilirubin Urine: NEGATIVE
Glucose, UA: >=500 mg/dL — AB
Ketones, ur: 20 mg/dL — AB
Leukocytes,Ua: NEGATIVE
Nitrite: NEGATIVE
Protein, ur: NEGATIVE mg/dL
Specific Gravity, Urine: 1.03 (ref 1.005–1.030)
pH: 5 (ref 5.0–8.0)

## 2020-11-28 NOTE — Progress Notes (Signed)
Patient resting in bed. Dressing dry and intact. No complaints at this time. Bed low, call bell in reach. Bed alarm on.

## 2020-11-28 NOTE — Progress Notes (Signed)
PT Cancellation Note  Patient Details Name: Noah Orozco MRN: [id number] DOB: February 04, 1961   Cancelled Treatment:    Reason Eval/Treat Not Completed: Fatigue/lethargy limiting ability to participate  Pt offered session this am and requested return at 11:00.  Returned at requested time and pt refused session.  Stated he had been in the bathroom x 45 minutes this am with tech and was fatigued.  "I'm going to send you away again."  Pt did walk to/from bathroom and not BSC.  Up in recliner.  Will continue tomorrow.   Danielle Dess 11/28/2020, 12:18 PM

## 2020-11-28 NOTE — Progress Notes (Signed)
   Subjective: 3 Days Post-Op Procedure(s) (LRB): TOTAL HIP ARTHROPLASTY ANTERIOR APPROACH (Right) Patient reports pain as mild.   Patient is  doing well, no complaints. Pain improving.  Has noted some blood in urine with dysuria.  No abdominal or flank pain. Denies any CP, SOB, ABD pain. We will continue therapy today.  Plan is to go Skilled nursing facility after hospital stay.  Objective: Vital signs in last 24 hours: Temp:  [98 F (36.7 C)-99.2 F (37.3 C)] 98.3 F (36.8 C) (06/12 0805) Pulse Rate:  [86-96] 86 (06/12 0805) Resp:  [16-18] 17 (06/12 0805) BP: (129-147)/(68-83) 147/68 (06/12 0805) SpO2:  [99 %-100 %] 99 % (06/12 0805)  Intake/Output from previous day: 06/11 0701 - 06/12 0700 In: 240 [P.O.:240] Out: 1150 [Urine:1150] Intake/Output this shift: No intake/output data recorded.  Recent Labs    11/25/20 1018 11/25/20 1529 11/26/20 0501 11/27/20 0519  HGB 15.0 13.4 12.4* 12.3*   Recent Labs    11/26/20 0501 11/27/20 0519  WBC 6.6 9.1  RBC 3.88* 3.81*  HCT 35.9* 35.9*  PLT 203 214   Recent Labs    11/25/20 1018 11/25/20 1529 11/26/20 0501  NA 136  --  136  K 4.2  --  4.4  CL 104  --  103  CO2  --   --  25  BUN 23  --  17  CREATININE 0.90 1.04 0.98  GLUCOSE 297*  --  228*  CALCIUM  --   --  8.3*   No results for input(s): LABPT, INR in the last 72 hours.  EXAM General - Patient is Alert, Appropriate, and Oriented Extremity - Neurovascular intact Sensation intact distally Intact pulses distally Dorsiflexion/Plantar flexion intact Dressing - dressing C/D/I and no drainage, prevena intact with out drainage Motor Function - intact, moving foot and toes well on exam.   Past Medical History:  Diagnosis Date   Anemia    Complication of anesthesia    woke up during colonoscopy    Diabetes mellitus without complication (HCC)    Diverticulitis large intestine    History of hiatal hernia    has been repaired   Hypertension    Pneumonia      Assessment/Plan:   3 Days Post-Op Procedure(s) (LRB): TOTAL HIP ARTHROPLASTY ANTERIOR APPROACH (Right) Active Problems:   S/P hip replacement  Estimated body mass index is 41.06 kg/m as calculated from the following:   Height as of this encounter: 5' 8.5" (1.74 m).   Weight as of this encounter: 124.3 kg. Advance diet Up with therapy Work on [employer] and VSS Dysuria -we will order urinalysis Pain controlled CM to assist with discharge to SNF Monday  DVT Prophylaxis - Lovenox, TED hose, and SCDs Weight-Bearing as tolerated to right leg   T. Cranston Neighbor, PA-C [facility]opa[facility] 11/28/2020, 8:34 AM

## 2020-11-28 NOTE — Plan of Care (Signed)
Patient had an uneventful shift. No changes in neurological and neurovascular assessments. Pain controlled with PRN medications. Vital signs within normal range.Denies any needs at this time. All Safety measures maintained. Care continues.  Problem: Education: Goal: Knowledge of General Education information will improve Description: Including pain rating scale, medication(s)/side effects and non-pharmacologic comfort measures Outcome: Progressing   Problem: Health Behavior/Discharge Planning: Goal: Ability to manage health-related needs will improve Outcome: Progressing   Problem: Clinical Measurements: Goal: Ability to maintain clinical measurements within normal limits will improve Outcome: Progressing Goal: Will remain free from infection Outcome: Progressing Goal: Diagnostic test results will improve Outcome: Progressing Goal: Respiratory complications will improve Outcome: Progressing Goal: Cardiovascular complication will be avoided Outcome: Progressing   Problem: Activity: Goal: Risk for activity intolerance will decrease Outcome: Progressing   Problem: Nutrition: Goal: Adequate nutrition will be maintained Outcome: Progressing   Problem: Coping: Goal: Level of anxiety will decrease Outcome: Progressing   Problem: Elimination: Goal: Will not experience complications related to bowel motility Outcome: Progressing Goal: Will not experience complications related to urinary retention Outcome: Progressing   Problem: Pain Managment: Goal: General experience of comfort will improve Outcome: Progressing   Problem: Safety: Goal: Ability to remain free from injury will improve Outcome: Progressing   Problem: Skin Integrity: Goal: Risk for impaired skin integrity will decrease Outcome: Progressing   Problem: Education: Goal: Knowledge of the prescribed therapeutic regimen will improve Outcome: Progressing Goal: Understanding of discharge needs will  improve Outcome: Progressing Goal: Individualized Educational Video(s) Outcome: Progressing   Problem: Activity: Goal: Ability to avoid complications of mobility impairment will improve Outcome: Progressing Goal: Ability to tolerate increased activity will improve Outcome: Progressing   Problem: Clinical Measurements: Goal: Postoperative complications will be avoided or minimized Outcome: Progressing   Problem: Pain Management: Goal: Pain level will decrease with appropriate interventions Outcome: Progressing   Problem: Skin Integrity: Goal: Will show signs of wound healing Outcome: Progressing   

## 2020-11-29 ENCOUNTER — Encounter: Payer: Self-pay | Admitting: Orthopedic Surgery

## 2020-11-29 LAB — GLUCOSE, CAPILLARY
Glucose-Capillary: 157 mg/dL — ABNORMAL HIGH (ref 70–99)
Glucose-Capillary: 104 mg/dL — ABNORMAL HIGH (ref 70–99)
Glucose-Capillary: 70 mg/dL (ref 70–99)
Glucose-Capillary: 156 mg/dL — ABNORMAL HIGH (ref 70–99)

## 2020-11-29 LAB — RESP PANEL BY RT-PCR (FLU A&B, COVID) ARPGX2
Influenza A by PCR: NEGATIVE
Influenza B by PCR: NEGATIVE
SARS Coronavirus 2 by RT PCR: POSITIVE — AB

## 2020-11-29 LAB — SURGICAL PATHOLOGY

## 2020-11-29 NOTE — TOC Progression Note (Addendum)
Transition of Care [facility]) - Progression Note    Patient Details  Name: Gilberto Stanforth MRN: [id number] Date of Birth: 1961/06/10  Transition of [facility]) CM/SW Contact  Barrie Dunker, RN Phone Number: 11/29/2020, 9:12 AM  Clinical Narrative:   Reached out to [facility], [facility], [facility] , [facility] in [place] and [facility] requesting for them to look at the patient for a possible bed offer    Expected Discharge Plan: Skilled Nursing Facility Barriers to Discharge: Continued Medical Work up  [facility] Expected Discharge Plan: Skilled Nursing Facility     Post Acute Care Choice: Skilled Nursing Facility Living arrangements for the past 2 months: Single Family Home Expected Discharge Date: 11/29/20                                     Social Determinants of Health (SDOH) Interventions    Readmission Risk Interventions No flowsheet data found.

## 2020-11-29 NOTE — Plan of Care (Signed)
Patient had an uneventful shift. No changes in neurological and neurovascular assessments. Pain controlled with PRN medications. Vital signs within normal range.Denies any needs at this time. All Safety measures maintained. Care continues.  Problem: Education: Goal: Knowledge of General Education information will improve Description: Including pain rating scale, medication(s)/side effects and non-pharmacologic comfort measures Outcome: Progressing   Problem: Health Behavior/Discharge Planning: Goal: Ability to manage health-related needs will improve Outcome: Progressing   Problem: Clinical Measurements: Goal: Ability to maintain clinical measurements within normal limits will improve Outcome: Progressing Goal: Will remain free from infection Outcome: Progressing Goal: Diagnostic test results will improve Outcome: Progressing Goal: Respiratory complications will improve Outcome: Progressing Goal: Cardiovascular complication will be avoided Outcome: Progressing   Problem: Activity: Goal: Risk for activity intolerance will decrease Outcome: Progressing   Problem: Nutrition: Goal: Adequate nutrition will be maintained Outcome: Progressing   Problem: Coping: Goal: Level of anxiety will decrease Outcome: Progressing   Problem: Elimination: Goal: Will not experience complications related to bowel motility Outcome: Progressing Goal: Will not experience complications related to urinary retention Outcome: Progressing   Problem: Pain Managment: Goal: General experience of comfort will improve Outcome: Progressing   Problem: Safety: Goal: Ability to remain free from injury will improve Outcome: Progressing   Problem: Skin Integrity: Goal: Risk for impaired skin integrity will decrease Outcome: Progressing   Problem: Education: Goal: Knowledge of the prescribed therapeutic regimen will improve Outcome: Progressing Goal: Understanding of discharge needs will  improve Outcome: Progressing Goal: Individualized Educational Video(s) Outcome: Progressing   Problem: Activity: Goal: Ability to avoid complications of mobility impairment will improve Outcome: Progressing Goal: Ability to tolerate increased activity will improve Outcome: Progressing   Problem: Clinical Measurements: Goal: Postoperative complications will be avoided or minimized Outcome: Progressing   Problem: Pain Management: Goal: Pain level will decrease with appropriate interventions Outcome: Progressing   Problem: Skin Integrity: Goal: Will show signs of wound healing Outcome: Progressing   

## 2020-11-29 NOTE — Progress Notes (Signed)
   Subjective: 4 Days Post-Op Procedure(s) (LRB): TOTAL HIP ARTHROPLASTY ANTERIOR APPROACH (Right) Patient reports pain as mild.   Patient is  doing well, no complaints. We will continue therapy today.  Plan is to go Skilled nursing facility after hospital stay.  Objective: Vital signs in last 24 hours: Temp:  [98.1 F (36.7 C)-100.9 F (38.3 C)] 98.8 F (37.1 C) (06/13 0809) Pulse Rate:  [91-102] 94 (06/13 0809) Resp:  [18-20] 18 (06/13 0809) BP: (132-149)/(72-78) 132/72 (06/13 0809) SpO2:  [98 %-100 %] 98 % (06/13 0809)  Intake/Output from previous day: 06/12 0701 - 06/13 0700 In: 480 [P.O.:480] Out: 700 [Urine:700] Intake/Output this shift: No intake/output data recorded.  Recent Labs    11/27/20 0519  HGB 12.3*   Recent Labs    11/27/20 0519  WBC 9.1  RBC 3.81*  HCT 35.9*  PLT 214   No results for input(s): NA, K, CL, CO2, BUN, CREATININE, GLUCOSE, CALCIUM in the last 72 hours.  No results for input(s): LABPT, INR in the last 72 hours.  EXAM General - Patient is Alert, Appropriate, and Oriented Extremity - Neurovascular intact Sensation intact distally Intact pulses distally Dorsiflexion/Plantar flexion intact Dressing - dressing C/D/I and no drainage, prevena intact with out drainage Motor Function - intact, moving foot and toes well on exam.   Past Medical History:  Diagnosis Date   Anemia    Complication of anesthesia    woke up during colonoscopy    Diabetes mellitus without complication (HCC)    Diverticulitis large intestine    History of hiatal hernia    has been repaired   Hypertension    Pneumonia     Assessment/Plan:   4 Days Post-Op Procedure(s) (LRB): TOTAL HIP ARTHROPLASTY ANTERIOR APPROACH (Right) Active Problems:   S/P hip replacement  Estimated body mass index is 41.06 kg/m as calculated from the following:   Height as of this encounter: 5' 8.5" (1.74 m).   Weight as of this encounter: 124.3 kg. Advance diet Up with  therapy Work on [employer] and VSS. Low grade temp last night. Encourage incentive spirometer Dysuria - U/A negative Pain controlled CM to assist with discharge to SNF today. Covid test ordered  DVT Prophylaxis - Lovenox, TED hose, and SCDs Weight-Bearing as tolerated to right leg   T. Cranston Neighbor, PA-C [facility] Orthopaed[facility] 11/29/2020, 8:13 AM

## 2020-11-29 NOTE — Progress Notes (Signed)
Physical Therapy Treatment Patient Details Name: Noah Orozco MRN: [id number] DOB: [birth year] Today's Date: 11/29/2020    History of Present Illness Pt is a 60 y.o. 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  PMH includes htn, hiatal hernia, DM, anemia, diverticulitis, PNA, colon surgery.    PT Comments    Stood and walked x 3 laps in room with RW and min guard/supervision.  Pt stops frequently during gait to relieve pain and do standing hip AROM.    Overall progressing well.  Discussed discharge plan. Pt continues to feel he needs SNF upon discharge. He stated he has many stairs into and in home and a lot of animals.  Will leave recommendations for SNF.   Follow Up Recommendations  SNF     Equipment Recommendations  Rolling walker with 5" wheels;3in1 (PT)    Recommendations for Other Services       Precautions / Restrictions Precautions Precautions: Anterior Hip;Fall Precaution Booklet Issued: Yes (comment) Precaution Comments: wound vac Restrictions Weight Bearing Restrictions: Yes RLE Weight Bearing: Weight bearing as tolerated    Mobility  Bed Mobility               General bed mobility comments: Pt was in recliner pre/psot session    Transfers Overall transfer level: Modified independent Equipment used: Rolling walker (2 wheeled) Transfers: Sit to/from Stand Sit to Stand: Supervision            Ambulation/Gait Ambulation/Gait assistance: Supervision;Min guard Gait Distance (Feet): 80 Feet Assistive device: Rolling walker (2 wheeled) Gait Pattern/deviations: Antalgic;Step-to pattern Gait velocity: decreased   General Gait Details: 3 laps to door in room.  stops frequently for AROM R LE   Stairs             Wheelchair Mobility    Modified Rankin (Stroke Patients Only)       Balance Overall balance assessment: Modified Independent   Sitting balance-Leahy Scale: Good     Standing balance support: Bilateral upper  extremity supported;During functional activity Standing balance-Leahy Scale: Good Standing balance comment: no LOB noted in standing or during gait with RW                            Cognition Arousal/Alertness: Awake/alert Behavior During Therapy: WFL for tasks assessed/performed Overall Cognitive Status: Within Functional Limits for tasks assessed                                        Exercises      General Comments        Pertinent Vitals/Pain Pain Assessment: Faces Faces Pain Scale: Hurts a little bit Pain Location: R hip Pain Descriptors / Indicators: Aching;Sore Pain Intervention(s): Limited activity within patient's tolerance;Monitored during session;Repositioned;Premedicated before session    Home Living                      Prior Function            PT Goals (current goals can now be found in the care plan section) Progress towards PT goals: Progressing toward goals    Frequency    BID      PT Plan Current plan remains appropriate    Co-evaluation              AM-PAC PT "6 Clicks" Mobility   Outcome  Measure  Help needed turning from your back to your side while in a flat bed without using bedrails?: A Little Help needed moving from lying on your back to sitting on the side of a flat bed without using bedrails?: A Little Help needed moving to and from a bed to a chair (including a wheelchair)?: A Little Help needed standing up from a chair using your arms (e.g., wheelchair or bedside chair)?: A Little Help needed to walk in hospital room?: A Little Help needed climbing 3-5 steps with a railing? : A Little 6 Click Score: 18    End of Session Equipment Utilized During Treatment: Gait belt Activity Tolerance: Patient tolerated treatment well Patient left: in chair;with call bell/phone within reach;with chair alarm set Nurse Communication: Mobility status;Precautions;Weight bearing status;Other (comment) PT  Visit Diagnosis: Other abnormalities of gait and mobility (R26.89);Muscle weakness (generalized) (M62.81);Difficulty in walking, not elsewhere classified (R26.2);Pain Pain - Right/Left: Right Pain - part of body: Hip     Time: 1325-1350 PT Time Calculation (min) (ACUTE ONLY): 25 min  Charges:  $Gait Training: 8-22 mins                     Danielle Dess, PTA 11/29/20, 2:22 PM '11/29/2020, 2:20 PM

## 2020-11-29 NOTE — Progress Notes (Signed)
Physical Therapy Treatment Patient Details Name: Noah Orozco MRN: [id number] DOB: 26-Apr-1961 Today's Date: 11/29/2020    History of Present Illness Pt is a 60 y.o. 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  PMH includes htn, hiatal hernia, DM, anemia, diverticulitis, PNA, colon surgery.    PT Comments    Pt able to stand and walk x 2 lap in room with RW and min guard.  Self initiated standing ex as needed.  Pt asking about stair training and when we would do it.  Typically if going to SNF stairs are addressed at facility.  He lives in split level house with 5 +7 steps to enter. 1 rail.  May need to consider step training tomorrow given new Covid + test.  He seemed to be thinking of a back up plan today if SNF was not available to him but steps do seem to be his biggest concern/barrier.  He stated he still prefers SNF but stated he was unsure now if any facility would accept him.  Will continue discussion with TOC and pt tomorrow and address stairs if necessary.    Follow Up Recommendations  SNF     Equipment Recommendations  Rolling walker with 5" wheels;3in1 (PT)    Recommendations for Other Services       Precautions / Restrictions Precautions Precautions: Anterior Hip;Fall Precaution Booklet Issued: Yes (comment) Precaution Comments: wound vac Restrictions Weight Bearing Restrictions: Yes RLE Weight Bearing: Weight bearing as tolerated    Mobility  Bed Mobility               General bed mobility comments: Pt was in recliner pre/psot session    Transfers Overall transfer level: Modified independent Equipment used: Rolling walker (2 wheeled) Transfers: Sit to/from Stand Sit to Stand: Supervision            Ambulation/Gait Ambulation/Gait assistance: Supervision;Min guard Gait Distance (Feet): 50 Feet Assistive device: Rolling walker (2 wheeled) Gait Pattern/deviations: Antalgic;Step-to pattern Gait velocity: decreased   General Gait  Details: 2 laps to door in room.  stops frequently for AROM R LE   Stairs             Wheelchair Mobility    Modified Rankin (Stroke Patients Only)       Balance Overall balance assessment: Modified Independent   Sitting balance-Leahy Scale: Good     Standing balance support: Bilateral upper extremity supported;During functional activity Standing balance-Leahy Scale: Good Standing balance comment: no LOB noted in standing or during gait with RW                            Cognition Arousal/Alertness: Awake/alert Behavior During Therapy: WFL for tasks assessed/performed Overall Cognitive Status: Within Functional Limits for tasks assessed                                        Exercises      General Comments        Pertinent Vitals/Pain Pain Assessment: Faces Faces Pain Scale: Hurts little more Pain Location: R hip Pain Descriptors / Indicators: Aching;Sore Pain Intervention(s): Limited activity within patient's tolerance;Monitored during session;Repositioned    Home Living                      Prior Function  PT Goals (current goals can now be found in the care plan section) Progress towards PT goals: Progressing toward goals    Frequency    BID      PT Plan Current plan remains appropriate    Co-evaluation              AM-PAC PT "6 Clicks" Mobility   Outcome Measure  Help needed turning from your back to your side while in a flat bed without using bedrails?: A Little Help needed moving from lying on your back to sitting on the side of a flat bed without using bedrails?: A Little Help needed moving to and from a bed to a chair (including a wheelchair)?: A Little Help needed standing up from a chair using your arms (e.g., wheelchair or bedside chair)?: A Little Help needed to walk in hospital room?: A Little Help needed climbing 3-5 steps with a railing? : A Little 6 Click Score: 18     End of Session Equipment Utilized During Treatment: Gait belt Activity Tolerance: Patient tolerated treatment well Patient left: in chair;with call bell/phone within reach;with chair alarm set Nurse Communication: Mobility status;Precautions;Weight bearing status;Other (comment) PT Visit Diagnosis: Other abnormalities of gait and mobility (R26.89);Muscle weakness (generalized) (M62.81);Difficulty in walking, not elsewhere classified (R26.2);Pain Pain - Right/Left: Right Pain - part of body: Hip     Time: 6301-6010 PT Time Calculation (min) (ACUTE ONLY): 13 min  Charges:  $Gait Training: 8-22 mins                    Danielle Dess, PTA 11/29/20, 4:25 PM , 4:21 PM

## 2020-11-30 LAB — GLUCOSE, CAPILLARY
Glucose-Capillary: 190 mg/dL — ABNORMAL HIGH (ref 70–99)
Glucose-Capillary: 121 mg/dL — ABNORMAL HIGH (ref 70–99)
Glucose-Capillary: 89 mg/dL (ref 70–99)
Glucose-Capillary: 263 mg/dL — ABNORMAL HIGH (ref 70–99)

## 2020-11-30 MED ORDER — MAGNESIUM HYDROXIDE 400 MG/5ML PO SUSP
30.0000 mL | Freq: Once | ORAL | Status: AC
  Administered 2020-11-30: 30 mL via ORAL
  Filled 2020-11-30: qty 30

## 2020-11-30 MED ORDER — SENNOSIDES-DOCUSATE SODIUM 8.6-50 MG PO TABS
2.0000 | ORAL_TABLET | Freq: Every day | ORAL | Status: DC
  Administered 2020-11-30 – 2020-12-04 (×5): 2 via ORAL
  Filled 2020-11-30 (×5): qty 2

## 2020-11-30 NOTE — Progress Notes (Signed)
   Subjective: 5 Days Post-Op Procedure(s) (LRB): TOTAL HIP ARTHROPLASTY ANTERIOR APPROACH (Right) Patient reports pain as mild.   Patient is  doing well, no complaints. + covid, afebrile. No cough/CP/SOB We will continue therapy today.  Plan is to go Skilled nursing facility after hospital stay.  Objective: Vital signs in last 24 hours: Temp:  [98.2 F (36.8 C)-100.1 F (37.8 C)] 98.5 F (36.9 C) (06/14 0825) Pulse Rate:  [82-93] 84 (06/14 0825) Resp:  [16-18] 16 (06/14 0825) BP: (108-138)/(56-74) 123/64 (06/14 0825) SpO2:  [97 %-98 %] 97 % (06/14 0825)  Intake/Output from previous day: 06/13 0701 - 06/14 0700 In: -  Out: 600 [Urine:600] Intake/Output this shift: No intake/output data recorded.  No results for input(s): HGB in the last 72 hours.  No results for input(s): WBC, RBC, HCT, PLT in the last 72 hours.  No results for input(s): NA, K, CL, CO2, BUN, CREATININE, GLUCOSE, CALCIUM in the last 72 hours.  No results for input(s): LABPT, INR in the last 72 hours.  EXAM General - Patient is Alert, Appropriate, and Oriented Extremity - Neurovascular intact Sensation intact distally Intact pulses distally Dorsiflexion/Plantar flexion intact Dressing - dressing C/D/I and no drainage, prevena intact with out drainage Motor Function - intact, moving foot and toes well on exam.   Past Medical History:  Diagnosis Date   Anemia    Complication of anesthesia    woke up during colonoscopy    Diabetes mellitus without complication (HCC)    Diverticulitis large intestine    History of hiatal hernia    has been repaired   Hypertension    Pneumonia     Assessment/Plan:   5 Days Post-Op Procedure(s) (LRB): TOTAL HIP ARTHROPLASTY ANTERIOR APPROACH (Right) Active Problems:   S/P hip replacement  Estimated body mass index is 41.06 kg/m as calculated from the following:   Height as of this encounter: 5' 8.5" (1.74 m).   Weight as of this encounter: 124.3 kg. Advance  diet Up with therapy Work on [employer] and VSS. Encourage incentive spirometer Pain controlled CM to assist with discharge. SNF unable to receive patient until 12/09/20.   DVT Prophylaxis - Lovenox, TED hose, and SCDs Weight-Bearing as tolerated to right leg   T. Cranston Neighbor, PA-C [facility] 11/30/2020, 10:48 AM

## 2020-11-30 NOTE — Progress Notes (Addendum)
Physical Therapy Treatment Patient Details Name: Noah Orozco MRN: [id number] DOB: 05/05/1961 Today's Date: 11/30/2020    History of Present Illness Pt is a 60 y.o. 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  PMH includes htn, hiatal hernia, DM, anemia, diverticulitis, PNA, colon surgery.    PT Comments    Stood and completed 4 laps with RW and min guard/supervision standing frequently for AROM self initiated due to tightness.  Reviewed need for stair training tomorrow.  Prefers to try stairs at the end of the day.  Will do AM session around 11:00 then stair training around 4 so gym can be closed off due to Covid + status.  Pt in agreement.  Will need to coordinate pain meds for best results.  Pt is progressing well with mobility.  If he is able to manage stair training, home with HHPT would be reasonable for discharge plan as SNF seems to be challenging due to Covid + diagnosis.  He seems more open to this idea.     Follow Up Recommendations  SNF;Other (comment)     Equipment Recommendations  Rolling walker with 5" wheels;3in1 (PT)    Recommendations for Other Services       Precautions / Restrictions Precautions Precautions: Anterior Hip;Fall Precaution Booklet Issued: Yes (comment) Precaution Comments: wound vac Restrictions Weight Bearing Restrictions: Yes RLE Weight Bearing: Weight bearing as tolerated    Mobility  Bed Mobility               General bed mobility comments: Pt was in recliner pre/pos session    Transfers Overall transfer level: Modified independent Equipment used: Rolling walker (2 wheeled) Transfers: Sit to/from Stand Sit to Stand: Supervision            Ambulation/Gait Ambulation/Gait assistance: Supervision;Min guard Gait Distance (Feet): 160 Feet Assistive device: Rolling walker (2 wheeled) Gait Pattern/deviations: Antalgic;Step-to pattern Gait velocity: decreased   General Gait Details: 4 laps to door and  back   Stairs             Wheelchair Mobility    Modified Rankin (Stroke Patients Only)       Balance Overall balance assessment: Modified Independent   Sitting balance-Leahy Scale: Good     Standing balance support: Bilateral upper extremity supported;During functional activity Standing balance-Leahy Scale: Good Standing balance comment: no LOB noted in standing or during gait with RW                            Cognition Arousal/Alertness: Awake/alert Behavior During Therapy: WFL for tasks assessed/performed Overall Cognitive Status: Within Functional Limits for tasks assessed                                        Exercises Other Exercises Other Exercises: self initiates standing ex during gait    General Comments        Pertinent Vitals/Pain Pain Assessment: Faces Faces Pain Scale: Hurts a little bit Pain Location: R hip Pain Descriptors / Indicators: Tightness Pain Intervention(s): Limited activity within patient's tolerance;Monitored during session;Repositioned    Home Living                      Prior Function            PT Goals (current goals can now be found in the care  plan section) Progress towards PT goals: Progressing toward goals    Frequency    BID      PT Plan Current plan remains appropriate    Co-evaluation              AM-PAC PT "6 Clicks" Mobility   Outcome Measure  Help needed turning from your back to your side while in a flat bed without using bedrails?: A Little Help needed moving from lying on your back to sitting on the side of a flat bed without using bedrails?: A Little Help needed moving to and from a bed to a chair (including a wheelchair)?: A Little Help needed standing up from a chair using your arms (e.g., wheelchair or bedside chair)?: None Help needed to walk in hospital room?: A Little Help needed climbing 3-5 steps with a railing? : A Little 6 Click Score:  19    End of Session Equipment Utilized During Treatment: Gait belt Activity Tolerance: Patient tolerated treatment well Patient left: in chair;with call bell/phone within reach;with chair alarm set Nurse Communication: Mobility status;Precautions;Weight bearing status;Other (comment) PT Visit Diagnosis: Other abnormalities of gait and mobility (R26.89);Muscle weakness (generalized) (M62.81);Difficulty in walking, not elsewhere classified (R26.2);Pain Pain - Right/Left: Right Pain - part of body: Hip     Time: 1500-1516 PT Time Calculation (min) (ACUTE ONLY): 16 min  Charges:  $Gait Training: 8-22 mins                    Danielle Dess, PTA 11/30/20, 3:39 PM , 3:37 PM

## 2020-11-30 NOTE — TOC Progression Note (Addendum)
Transition of Care [facility]) - Progression Note    Patient Details  Name: Noah Orozco MRN: [id number] Date of Birth: 06/30/1960  [facility] CM/SW Contact  Barrie Dunker, RN Phone Number: 11/30/2020, 9:57 AM  Clinical Narrative:   Reached out to [facility] at [facility] to confirm the bed offer with the patient being Covid Positive prior to offering the bed to the patient, the patient positive Covid Test was 11/29/20 neg on previous test on 11/23/20, She stated that the patient had to wait 10 days past Covid positive, will be 6/23 before they can accept the patient    Expected Discharge Plan: Skilled Nursing Facility Barriers to Discharge: Continued Medical Work up  Expected Discharge Plan and Services Expected Discharge Plan: Skilled Nursing Facility     Post Acute Care Choice: Skilled Nursing Facility Living arrangements for the past 2 months: Single Family Home Expected Discharge Date: 11/29/20                                     Social Determinants of Health (SDOH) Interventions    Readmission Risk Interventions No flowsheet data found.

## 2020-11-30 NOTE — Progress Notes (Signed)
Physical Therapy Treatment Patient Details Name: Noah Orozco MRN: [id number] DOB: 08-Feb-1961 Today's Date: 11/30/2020    History of Present Illness Pt is a 60 y.o. 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  PMH includes htn, hiatal hernia, DM, anemia, diverticulitis, PNA, colon surgery.    PT Comments    Stood and completes 4 laps in room with RW and min guard/supervision.  Self initiates standing ex during gait frequently due to tightness.  Discussed discharge.  Pt with no SNF bed due to Covid + this date.  Per TOC notes may need to wait until  10 days out.  At that point pt will likely  no longer need SNF.  Discussed need to address stair training sooner than later as mobility is progressing.  He initially stated to wait a few days but given mobility quality it is reasonable to address stair training tomorrow.  Will need to coordinate pain meds and time to allow for gym to be closed.  Pt agrees with encouragement to try tomorrow.  Will likely need much encouragement to follow through.   Follow Up Recommendations  SNF     Equipment Recommendations  Rolling walker with 5" wheels;3in1 (PT)    Recommendations for Other Services       Precautions / Restrictions Precautions Precautions: Anterior Hip;Fall Precaution Booklet Issued: Yes (comment) Precaution Comments: wound vac Restrictions Weight Bearing Restrictions: Yes RLE Weight Bearing: Weight bearing as tolerated    Mobility  Bed Mobility               General bed mobility comments: Pt was in recliner pre/pos session    Transfers Overall transfer level: Modified independent Equipment used: Rolling walker (2 wheeled) Transfers: Sit to/from Stand              Ambulation/Gait Ambulation/Gait assistance: Supervision;Min guard   Assistive device: Rolling walker (2 wheeled) Gait Pattern/deviations: Antalgic;Step-to pattern Gait velocity: decreased   General Gait Details: 4 laps to door and  back   Social research officer, government Rankin (Stroke Patients Only)       Balance Overall balance assessment: Modified Independent   Sitting balance-Leahy Scale: Good     Standing balance support: Bilateral upper extremity supported;During functional activity Standing balance-Leahy Scale: Good Standing balance comment: no LOB noted in standing or during gait with RW                            Cognition Arousal/Alertness: Awake/alert Behavior During Therapy: WFL for tasks assessed/performed Overall Cognitive Status: Within Functional Limits for tasks assessed                                        Exercises Other Exercises Other Exercises: self initiates standing ex during gait    General Comments        Pertinent Vitals/Pain Pain Assessment: Faces Faces Pain Scale: Hurts little more Pain Descriptors / Indicators: Tightness Pain Intervention(s): Limited activity within patient's tolerance;Monitored during session;Repositioned    Home Living                      Prior Function            PT Goals (current goals can now be found in the care  plan section) Progress towards PT goals: Progressing toward goals    Frequency    BID      PT Plan Current plan remains appropriate    Co-evaluation              AM-PAC PT "6 Clicks" Mobility   Outcome Measure  Help needed turning from your back to your side while in a flat bed without using bedrails?: A Little Help needed moving from lying on your back to sitting on the side of a flat bed without using bedrails?: A Little Help needed moving to and from a bed to a chair (including a wheelchair)?: A Little Help needed standing up from a chair using your arms (e.g., wheelchair or bedside chair)?: None Help needed to walk in hospital room?: A Little Help needed climbing 3-5 steps with a railing? : A Little 6 Click Score: 19    End of Session  Equipment Utilized During Treatment: Gait belt Activity Tolerance: Patient tolerated treatment well Patient left: in chair;with call bell/phone within reach;with chair alarm set Nurse Communication: Mobility status;Precautions;Weight bearing status;Other (comment) PT Visit Diagnosis: Other abnormalities of gait and mobility (R26.89);Muscle weakness (generalized) (M62.81);Difficulty in walking, not elsewhere classified (R26.2);Pain Pain - Right/Left: Right Pain - part of body: Hip     Time: 1100-1119 PT Time Calculation (min) (ACUTE ONLY): 19 min  Charges:  $Gait Training: 8-22 mins                    Danielle Dess, PTA 11/30/20, 12:47 PM , 12:44 PM

## 2020-12-01 LAB — GLUCOSE, CAPILLARY
Glucose-Capillary: 207 mg/dL — ABNORMAL HIGH (ref 70–99)
Glucose-Capillary: 185 mg/dL — ABNORMAL HIGH (ref 70–99)
Glucose-Capillary: 138 mg/dL — ABNORMAL HIGH (ref 70–99)
Glucose-Capillary: 317 mg/dL — ABNORMAL HIGH (ref 70–99)

## 2020-12-01 NOTE — Progress Notes (Signed)
   Subjective: 6 Days Post-Op Procedure(s) (LRB): TOTAL HIP ARTHROPLASTY ANTERIOR APPROACH (Right) Patient reports pain as mild.   Patient is  doing well, no complaints. + covid, afebrile. No cough/CP/SOB We will continue therapy today.  Plan is to go Skilled nursing facility after hospital stay.  Objective: Vital signs in last 24 hours: Temp:  [98.2 F (36.8 C)-98.5 F (36.9 C)] 98.3 F (36.8 C) (06/15 0403) Pulse Rate:  [66-84] 66 (06/15 0403) Resp:  [16-18] 18 (06/15 0403) BP: (91-123)/(56-79) 101/64 (06/15 0403) SpO2:  [97 %-100 %] 98 % (06/15 0403)  Intake/Output from previous day: 06/14 0701 - 06/15 0700 In: -  Out: 800 [Urine:800] Intake/Output this shift: No intake/output data recorded.  No results for input(s): HGB in the last 72 hours.  No results for input(s): WBC, RBC, HCT, PLT in the last 72 hours.  No results for input(s): NA, K, CL, CO2, BUN, CREATININE, GLUCOSE, CALCIUM in the last 72 hours.  No results for input(s): LABPT, INR in the last 72 hours.  EXAM General - Patient is Alert, Appropriate, and Oriented Extremity - Neurovascular intact Sensation intact distally Intact pulses distally Dorsiflexion/Plantar flexion intact Dressing - dressing C/D/I and no drainage, prevena intact with out drainage Motor Function - intact, moving foot and toes well on exam.   Past Medical History:  Diagnosis Date   Anemia    Complication of anesthesia    woke up during colonoscopy    Diabetes mellitus without complication (HCC)    Diverticulitis large intestine    History of hiatal hernia    has been repaired   Hypertension    Pneumonia     Assessment/Plan:   6 Days Post-Op Procedure(s) (LRB): TOTAL HIP ARTHROPLASTY ANTERIOR APPROACH (Right) Active Problems:   S/P hip replacement  Estimated body mass index is 41.06 kg/m as calculated from the following:   Height as of this encounter: 5' 8.5" (1.74 m).   Weight as of this encounter: 124.3 kg. Advance  diet Up with therapy Work on [employer] and VSS. Encourage incentive spirometer Pain controlled CM to assist with discharge. SNF unable to receive patient until 12/09/20.  May dc to home with HHPT if patient can safely perform stairs independently.  DVT Prophylaxis - Lovenox, TED hose, and SCDs Weight-Bearing as tolerated to right leg   T. Cranston Neighbor, PA-C [facility] 12/01/2020, 7:55 AM

## 2020-12-01 NOTE — TOC Progression Note (Signed)
Transition of Care [facility]) - Progression Note    Patient Details  Name: Noah Orozco MRN: [id number] Date of Birth: [birth year]  [facility] CM/SW Contact  Noah Hilt, RN Phone Number: 12/01/2020, 8:56 AM  Clinical Narrative:   The patient is doing well and walking more than a household distance, He is unable to go to SNF at this time due to having covid, I [facility] to check to see if the patient is on their list for [facility] services set up prior to surgery by Dr office, Spoke with [place] she stated that he is out of the service area and they cant take the patient,  Called [facility] and they are unable to accept the patient for [facility] services, the patient will have a copay of 150$ per visit with the insurance that he has. Called [facility]  They are checking to see if they  able to accept and will call back ,  They called back and stated that they are not able to accept the patint that he is out of their service area. I called [facility], they are unable to accept the patient.  I called [facility] I spoke with intake, they have had the patient in the past, They are checking to see if they can accept the patient and will call back.  They called back and stated that they are not able to accept the patient. I called [place] [phone], I provided them with his Insurance information to determine if they accept the patient, They called back and stated that they can not accept the patient. I called [facility] and left a voice mail requesting them to accept the patient, awaiting a call back.  I called into the room to speak to the patient and was unable to reach him, I called his wife Noah Orozco.   She  is unhappy that the patient can't go to rehab until next week, I explained to her once the patient is medically stable and walking well enough that the  recommendation changes to Home Health due to walking more than a house hold  distance,  that he will be discharged and that insurance will no longer cover a hospital stay.   I explained that I do not determine when he will be discharged but wanted to be prepared for when it occurs and to make sure his needs are met,   I explained that he is continuing to work with PT while he is here, I explained he is walking over 100 feet.   She feels that he should be allowed to stay in the hospital until past the 10 days from testing positive for Covid, I explained that once a patient is medically stable insurance will no longer cover the hospital stay.  She stated that she took care of her mother at home  but now they have a large over 4000 sq feet home and does not feel he will be able to walk all over the house. I explained that often patient's will stay in a main area until they are able to ambulate thru out the house, She stated that she feels he should not come home until he can go all over the house.  I explained that would not be covered by insurance and that even after rehab he may not be able to walk all over the house unassisted having a home that large.  She stated that he has  a cane and a rollator and needs a rolling walker, He needs a BSC, He will need a bariatric for both. she requested a hospital bed, I explained he does not have a diagnosis to support insurance covering a hospital bed but if they would like one it would be out of pocket, she declined.   She stated that they would like to do Outpatient PT at [facility], I called [facility] to check to see how long a patient has to be covid free to go to Outpatient PT. They stated that the patient has to be 10 days from a covid positive test and they do not have appointments for 3 weeks, The number the patient will need to call to get the outpatient PT set up once finished with [facility] is [phone] option 1. This number will be provided to the patient an his wife.  She asked how the patient will get up the 5 steps to get  into their home, I explained that he will be working with PT here to cover going up and down steps however,  if they still feel that they need it a non urgent EMS can be arranged however if Insurance does not cover it they would be responsible for the cost. She stated that she is frustrated with the situation, This TOC CM allowed her to vent her frustrations.    At this time [facility] CM is unable to find Home health services for this patient.    Expected Discharge Plan: [place] Barriers to Discharge: Continued Medical Work up  Expected Discharge Plan and Services Expected Discharge Plan: [place] Choice: [place] arrangements for the past 2 months: Single Family Home Expected Discharge Date: 11/29/20                                     Social Determinants of Health (SDOH) Interventions    Readmission Risk Interventions No flowsheet data found.

## 2020-12-01 NOTE — Progress Notes (Signed)
Physical Therapy Treatment Patient Details Name: Noah Orozco MRN: [id number] DOB: [birth year] Today's Date: 12/01/2020    History of Present Illness Pt is a 60 y.o. 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  PMH includes htn, hiatal hernia, DM, anemia, diverticulitis, PNA, colon surgery.    PT Comments    On arrival pt upset about continuing discharge planning drama but was willing to work with PT and with increased time during session was able to focus on the task at hand and did ultimately show good effort and attitude.  He did not need direct assist with getting to EOB, with multiple sit to stand efforts and was able to ambulate in-home distances slowly, but safely.  Pt showed great effort with exercises, with expected POD6 weakness.    Follow Up Recommendations  Other (comment) (TBD)     Equipment Recommendations  Rolling walker with 5" wheels;3in1 (PT)    Recommendations for Other Services       Precautions / Restrictions Precautions Precautions: Anterior Hip;Fall Restrictions Weight Bearing Restrictions: Yes RLE Weight Bearing: Weight bearing as tolerated    Mobility  Bed Mobility Overal bed mobility: Needs Assistance Bed Mobility: Supine to Sit     Supine to sit: Min guard     General bed mobility comments: gets to sitting EOB w/o direct assist and with relative ease    Transfers Overall transfer level: Modified independent Equipment used: Rolling walker (2 wheeled) Transfers: Sit to/from Stand Sit to Stand: Supervision         General transfer comment: Pt was able to stand to RW from lowest bed height without physical assistance. mild hesitancy, able to sit onto [facility] with control  Ambulation/Gait Ambulation/Gait assistance: Supervision Gait Distance (Feet): 150 Feet Assistive device: Rolling walker (2 wheeled)       General Gait Details: Pt initially with very hesitant gait but did not have any overt safety issues.  As he increased  distance he showed decreased reliance on the walker and improved limp (raised RW ~1" for improved posture/mechanics).  His O2 remained in the high 90s, HR up to 110s with no excessive fatigue.   Stairs             Wheelchair Mobility    Modified Rankin (Stroke Patients Only)       Balance Overall balance assessment: Modified Independent Sitting-balance support: No upper extremity supported;Feet supported Sitting balance-Leahy Scale: Good     Standing balance support: Bilateral upper extremity supported;During functional activity Standing balance-Leahy Scale: Good                              Cognition Arousal/Alertness: Awake/alert Behavior During Therapy: WFL for tasks assessed/performed Overall Cognitive Status: Within Functional Limits for tasks assessed                                        Exercises Total Joint Exercises Quad Sets: Strengthening;10 reps Heel Slides: AROM;Strengthening;10 reps (resisted leg ext) Hip ABduction/ADduction: 10 reps;AROM;Strengthening Straight Leg Raises: AAROM;5 reps Long Arc Quad: Strengthening;10 reps Knee Flexion: Strengthening;10 reps    General Comments        Pertinent Vitals/Pain Pain Assessment: 0-10 Pain Score: 7  Pain Location: R hip    Home Living  Prior Function            PT Goals (current goals can now be found in the care plan section) Progress towards PT goals: Progressing toward goals    Frequency    BID      PT Plan Current plan remains appropriate    Co-evaluation              AM-PAC PT "6 Clicks" Mobility   Outcome Measure  Help needed turning from your back to your side while in a flat bed without using bedrails?: None Help needed moving from lying on your back to sitting on the side of a flat bed without using bedrails?: None Help needed moving to and from a bed to a chair (including a wheelchair)?: None Help needed  standing up from a chair using your arms (e.g., wheelchair or bedside chair)?: None Help needed to walk in hospital room?: A Little Help needed climbing 3-5 steps with a railing? : A Little 6 Click Score: 22    End of Session   Activity Tolerance: Patient limited by pain;Patient tolerated treatment well Patient left: with bed alarm set;with call bell/phone within reach Nurse Communication: Mobility status PT Visit Diagnosis: Other abnormalities of gait and mobility (R26.89);Muscle weakness (generalized) (M62.81);Difficulty in walking, not elsewhere classified (R26.2);Pain Pain - Right/Left: Right Pain - part of body: Hip     Time: 9528-4132 PT Time Calculation (min) (ACUTE ONLY): 49 min  Charges:  $Gait Training: 8-22 mins $Therapeutic Exercise: 8-22 mins $Therapeutic Activity: 8-22 mins                     Malachi Pro, DPT 12/01/2020, 1:02 PM

## 2020-12-01 NOTE — Progress Notes (Signed)
Physical Therapy Treatment Patient Details Name: Noah Orozco MRN: [id number] DOB: 08-Jul-1960 Today's Date: 12/01/2020    History of Present Illness Pt is a 60 y.o. 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  PMH includes htn, hiatal hernia, DM, anemia, diverticulitis, PNA, colon surgery.    PT Comments    Pt continues to need some extra time to warm up when first standing/putting weight on the R, as well as some hesitancy with any prolonged activity but ultimately did well with first trial of steps.  He was very reliant on UEs/rails and showed some Trendelenberg hip drop 2/2 R hip Abd weakness.  Educated on appropriate strategies for getting in/out of the home.  Pt highly motivated, showed appropriate vitals but did fatigue with prolonged effort.  Pt making good gains.    Follow Up Recommendations  Home health PT (TBD)     Equipment Recommendations  Rolling walker with 5" wheels;3in1 (PT)    Recommendations for Other Services       Precautions / Restrictions Precautions Precautions: Anterior Hip;Fall Restrictions RLE Weight Bearing: Weight bearing as tolerated    Mobility  Bed Mobility               General bed mobility comments: in recliner on arrival, returned to recliner post session    Transfers Overall transfer level: Modified independent Equipment used: Rolling walker (2 wheeled) Transfers: Sit to/from Stand Sit to Stand: Supervision         General transfer comment: able to stand multiple times t/o the session, no assist, appropriate use of UEs  Ambulation/Gait Ambulation/Gait assistance: Supervision Gait Distance (Feet): 100 Feet Assistive device: Rolling walker (2 wheeled)       General Gait Details: Pt again with intial step hesitancy, but relatively quick to warm up and increase confidence and cadence   Stairs Stairs: Yes Stairs assistance: Supervision Stair Management: One rail Right Number of Stairs: 12 General stair  comments: Pt showed great effort with stair negotiation.  Heavy reliance on UEs, but able to negotiate steps slowly and safely.  Pt has some issues due to R hip ABd weakness during R single leg standing during step transitions   Wheelchair Mobility    Modified Rankin (Stroke Patients Only)       Balance Overall balance assessment: Modified Independent Sitting-balance support: No upper extremity supported;Feet supported Sitting balance-Leahy Scale: Good     Standing balance support: Bilateral upper extremity supported;During functional activity Standing balance-Leahy Scale: Good Standing balance comment: heavy reliance on walker, on LOBs                            Cognition Arousal/Alertness: Awake/alert Behavior During Therapy: WFL for tasks assessed/performed Overall Cognitive Status: Within Functional Limits for tasks assessed                                        Exercises Total Joint Exercises Quad Sets: Strengthening;10 reps Short Arc Quad: Strengthening;10 reps Heel Slides: Strengthening;10 reps (with resisted leg ext) Hip ABduction/ADduction: Strengthening;20 reps    General Comments        Pertinent Vitals/Pain Pain Score: 7  Pain Location: R hip and even more so thigh    Home Living                      Prior  Function            PT Goals (current goals can now be found in the care plan section) Progress towards PT goals: Progressing toward goals    Frequency    BID      PT Plan Current plan remains appropriate    Co-evaluation              AM-PAC PT "6 Clicks" Mobility   Outcome Measure  Help needed turning from your back to your side while in a flat bed without using bedrails?: None Help needed moving from lying on your back to sitting on the side of a flat bed without using bedrails?: None Help needed moving to and from a bed to a chair (including a wheelchair)?: None Help needed standing up  from a chair using your arms (e.g., wheelchair or bedside chair)?: None Help needed to walk in hospital room?: A Little Help needed climbing 3-5 steps with a railing? : A Little 6 Click Score: 22    End of Session   Activity Tolerance: Patient limited by pain;Patient tolerated treatment well Patient left: with bed alarm set;with call bell/phone within reach Nurse Communication: Mobility status PT Visit Diagnosis: Other abnormalities of gait and mobility (R26.89);Muscle weakness (generalized) (M62.81);Difficulty in walking, not elsewhere classified (R26.2);Pain Pain - Right/Left: Right Pain - part of body: Hip     Time: 1017-5102 PT Time Calculation (min) (ACUTE ONLY): 47 min  Charges:  $Gait Training: 23-37 mins $Therapeutic Exercise: 8-22 mins                     Malachi Pro, DPT 12/01/2020, 5:45 PM

## 2020-12-02 LAB — CREATININE, SERUM
Creatinine, Ser: 1.4 mg/dL — ABNORMAL HIGH (ref 0.61–1.24)
GFR, Estimated: 57 mL/min — ABNORMAL LOW (ref >60)

## 2020-12-02 LAB — GLUCOSE, CAPILLARY
Glucose-Capillary: 256 mg/dL — ABNORMAL HIGH (ref 70–99)
Glucose-Capillary: 199 mg/dL — ABNORMAL HIGH (ref 70–99)
Glucose-Capillary: 270 mg/dL — ABNORMAL HIGH (ref 70–99)

## 2020-12-02 MED ORDER — FLEET ENEMA 7-19 GM/118ML RE ENEM
1.0000 | ENEMA | Freq: Once | RECTAL | Status: AC
  Administered 2020-12-02: 1 via RECTAL

## 2020-12-02 MED ORDER — INSULIN ASPART 100 UNIT/ML IJ SOLN
0.0000 [IU] | Freq: Four times a day (QID) | INTRAMUSCULAR | Status: DC | PRN
  Administered 2020-12-02: 8 [IU] via SUBCUTANEOUS
  Administered 2020-12-02: 3 [IU] via SUBCUTANEOUS
  Administered 2020-12-03: 8 [IU] via SUBCUTANEOUS
  Administered 2020-12-03: 5 [IU] via SUBCUTANEOUS
  Administered 2020-12-03: 3 [IU] via SUBCUTANEOUS
  Administered 2020-12-03: 11 [IU] via SUBCUTANEOUS
  Administered 2020-12-04: 2 [IU] via SUBCUTANEOUS
  Filled 2020-12-02 (×6): qty 1

## 2020-12-02 NOTE — Progress Notes (Signed)
Physical Therapy Treatment Patient Details Name: Noah Orozco MRN: [id number] DOB: 1961-06-03 Today's Date: 12/02/2020    History of Present Illness Pt is a 60 y.o. 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  PMH includes htn, hiatal hernia, DM, anemia, diverticulitis, PNA, colon surgery.    PT Comments    Pt continues to show good effort with all aspects of PT.  Able to negotiate up/down 4 steps X 4 with minimal need for cuing and no direct assist. Pt with increased effectiveness and confidence with this today.  Reports he has been doing some exercises, performed well with resisted seated exercises with PT.  Making good gains, safe to manage in the home w/o needing physical assist from PT perspective.      Follow Up Recommendations  Home health PT     Equipment Recommendations  Rolling walker with 5" wheels;3in1 (PT)    Recommendations for Other Services       Precautions / Restrictions Precautions Precautions: Anterior Hip;Fall Restrictions Weight Bearing Restrictions: Yes RLE Weight Bearing: Weight bearing as tolerated    Mobility  Bed Mobility               General bed mobility comments: in recliner pre and post session    Transfers Overall transfer level: Modified independent Equipment used: Rolling walker (2 wheeled) Transfers: Sit to/from Stand Sit to Stand: Modified independent (Device/Increase time)         General transfer comment: multiple sit to stand effort w/o assist or issue  Ambulation/Gait Ambulation/Gait assistance: Modified independent (Device/Increase time) Gait Distance (Feet): 100 Feet Assistive device: Rolling walker (2 wheeled)       General Gait Details: some initial hesiatancy but quick to improve cadence.  Cues for walker use and normalization/symmetry with step length.  Continues to have decreased stance time tolerance on the R with some Trendelenberg drop but safe with no LOBs or overt safety  issues.   Stairs Stairs: Yes Stairs assistance: Supervision Stair Management: One rail Right Number of Stairs: 16 General stair comments: 2 bouts of 8 steps.  No assist needed, tried different UE and LE strategies to maximize comfort and effectiveness.  per typical some hesitancy but no overt safety issues of assist needed   Wheelchair Mobility    Modified Rankin (Stroke Patients Only)       Balance     Sitting balance-Leahy Scale: Good       Standing balance-Leahy Scale: Good                              Cognition Arousal/Alertness: Awake/alert Behavior During Therapy: WFL for tasks assessed/performed Overall Cognitive Status: Within Functional Limits for tasks assessed                                        Exercises Total Joint Exercises Hip ABduction/ADduction: Strengthening;20 reps Long Arc Quad: Strengthening;10 reps    General Comments        Pertinent Vitals/Pain Pain Score: 6  Pain Location: R hip and thigh    Home Living                      Prior Function            PT Goals (current goals can now be found in the care plan section) Progress  towards PT goals: Progressing toward goals    Frequency    BID      PT Plan Current plan remains appropriate    Co-evaluation              AM-PAC PT "6 Clicks" Mobility   Outcome Measure  Help needed turning from your back to your side while in a flat bed without using bedrails?: None Help needed moving from lying on your back to sitting on the side of a flat bed without using bedrails?: None Help needed moving to and from a bed to a chair (including a wheelchair)?: None Help needed standing up from a chair using your arms (e.g., wheelchair or bedside chair)?: None Help needed to walk in hospital room?: A Little Help needed climbing 3-5 steps with a railing? : A Little 6 Click Score: 22    End of Session Equipment Utilized During Treatment: Gait  belt Activity Tolerance: Patient limited by pain;Patient tolerated treatment well Patient left: in chair;with call bell/phone within reach Nurse Communication: Mobility status PT Visit Diagnosis: Other abnormalities of gait and mobility (R26.89);Muscle weakness (generalized) (M62.81);Difficulty in walking, not elsewhere classified (R26.2);Pain Pain - Right/Left: Right Pain - part of body: Hip     Time: 1620-1700 PT Time Calculation (min) (ACUTE ONLY): 40 min  Charges:  $Gait Training: 23-37 mins $Therapeutic Exercise: 8-22 mins                     Malachi Pro, DPT 12/02/2020, 6:42 PM

## 2020-12-02 NOTE — Progress Notes (Signed)
Physical Therapy Treatment Patient Details Name: Noah Orozco MRN: [id number] DOB: Feb 09, 1961 Today's Date: 12/02/2020    History of Present Illness Pt is a 60 y.o. 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  PMH includes htn, hiatal hernia, DM, anemia, diverticulitis, PNA, colon surgery.    PT Comments    Pt continues to do relatively well with mobility, ambulation and in general with PT.  He continues to have continued weakness with L hip Abd along with decreased standing tolerance on the L.  However he was able to safely do mobility, transfers and >200 ft of ambulation w/o direct assist and w/o physical assist.  He has pain more in the thigh than that incision area, but again is able to do activities congruent with management in the home w/o needing direct physical assist.  Plan to attempt stairs again this afternoon to further increase pt confidence and awareness with this critical aspect of home management.    Follow Up Recommendations  Home health PT     Equipment Recommendations  Rolling walker with 5" wheels;3in1 (PT)    Recommendations for Other Services       Precautions / Restrictions Precautions Precautions: Anterior Hip;Fall Restrictions RLE Weight Bearing: Weight bearing as tolerated    Mobility  Bed Mobility Overal bed mobility: Needs Assistance Bed Mobility: Supine to Sit     Supine to sit: Min guard     General bed mobility comments: Pt able to get himself to sitting EOB w/o assist, heavy UE use/reliance on rails    Transfers Overall transfer level: Modified independent Equipment used: Rolling walker (2 wheeled) Transfers: Sit to/from Stand Sit to Stand: Supervision         General transfer comment: Pt needed some momentum and heavy UE use get get to standing from low bed position, but no direct assist from PT  Ambulation/Gait Ambulation/Gait assistance: Supervision Gait Distance (Feet): 220 Feet Assistive device: Rolling walker  (2 wheeled)       General Gait Details: Per his usual he had some initial hesiatncy but was able to warm up relatively quickly.  Cues for walker use and normalization/symmetry with step length.  Continues to have decreased stance time tolerance on the R with some Trendelenberg drop but safe and able to maintain prolonged cadence with many in-room turns with no LOBs or overt safety issues.   Stairs Stairs:  (plan to trial again this afternoon)           Wheelchair Mobility    Modified Rankin (Stroke Patients Only)       Balance   Sitting-balance support: No upper extremity supported;Feet supported Sitting balance-Leahy Scale: Good     Standing balance support: Bilateral upper extremity supported;During functional activity Standing balance-Leahy Scale: Good Standing balance comment: reliance on walker, no LOBs                            Cognition Arousal/Alertness: Awake/alert Behavior During Therapy: WFL for tasks assessed/performed Overall Cognitive Status: Within Functional Limits for tasks assessed                                        Exercises Total Joint Exercises Quad Sets: Strengthening;15 reps Short Arc Quad: Strengthening;15 reps Heel Slides: Strengthening;15 reps (with resisted leg ext) Hip ABduction/ADduction: Strengthening;20 reps Straight Leg Raises: AAROM;AROM;10 reps (nearly able to  raise against gravity w/o assist on 2 reps)    General Comments        Pertinent Vitals/Pain Pain Score: 5  Pain Location: R hip and even more so thigh    Home Living                      Prior Function            PT Goals (current goals can now be found in the care plan section) Progress towards PT goals: Progressing toward goals    Frequency    BID      PT Plan Current plan remains appropriate    Co-evaluation              AM-PAC PT "6 Clicks" Mobility   Outcome Measure  Help needed turning from your  back to your side while in a flat bed without using bedrails?: None Help needed moving from lying on your back to sitting on the side of a flat bed without using bedrails?: None Help needed moving to and from a bed to a chair (including a wheelchair)?: None Help needed standing up from a chair using your arms (e.g., wheelchair or bedside chair)?: None Help needed to walk in hospital room?: A Little Help needed climbing 3-5 steps with a railing? : A Little 6 Click Score: 22    End of Session Equipment Utilized During Treatment: Gait belt Activity Tolerance: Patient limited by pain;Patient tolerated treatment well Patient left: in chair;with call bell/phone within reach (on [facility] Nurse Communication: Mobility status PT Visit Diagnosis: Other abnormalities of gait and mobility (R26.89);Muscle weakness (generalized) (M62.81);Difficulty in walking, not elsewhere classified (R26.2);Pain Pain - Right/Left: Right Pain - part of body: Hip     Time: 5638-7564 PT Time Calculation (min) (ACUTE ONLY): 34 min  Charges:  $Gait Training: 8-22 mins $Therapeutic Exercise: 8-22 mins                     Malachi Pro, DPT 12/02/2020, 1:25 PM

## 2020-12-02 NOTE — Progress Notes (Signed)
   Subjective: 7 Days Post-Op Procedure(s) (LRB): TOTAL HIP ARTHROPLASTY ANTERIOR APPROACH (Right) Patient reports pain as mild.   Patient is  doing well, no complaints.  Pending BM, passing gas. Tolerating po well + covid, afebrile. No cough/CP/SOB We will continue therapy today. Making good progress with PT   Objective: Vital signs in last 24 hours: Temp:  [98 F (36.7 C)-98.7 F (37.1 C)] 98 F (36.7 C) (06/16 0539) Pulse Rate:  [67-93] 67 (06/16 0539) Resp:  [16-20] 20 (06/16 0539) BP: (93-128)/(62-82) 128/73 (06/16 0539) SpO2:  [94 %-100 %] 100 % (06/16 0539)  Intake/Output from previous day: 06/15 0701 - 06/16 0700 In: -  Out: 900 [Urine:900] Intake/Output this shift: No intake/output data recorded.  No results for input(s): HGB in the last 72 hours.  No results for input(s): WBC, RBC, HCT, PLT in the last 72 hours.  Recent Labs    12/02/20 0357  CREATININE 1.40*    No results for input(s): LABPT, INR in the last 72 hours.  EXAM General - Patient is Alert, Appropriate, and Oriented Extremity - Neurovascular intact Sensation intact distally Intact pulses distally Dorsiflexion/Plantar flexion intact Dressing - dressing C/D/I and no drainage, prevena intact with out drainage Motor Function - intact, moving foot and toes well on exam.   Past Medical History:  Diagnosis Date   Anemia    Complication of anesthesia    woke up during colonoscopy    Diabetes mellitus without complication (HCC)    Diverticulitis large intestine    History of hiatal hernia    has been repaired   Hypertension    Pneumonia     Assessment/Plan:   7 Days Post-Op Procedure(s) (LRB): TOTAL HIP ARTHROPLASTY ANTERIOR APPROACH (Right) Active Problems:   S/P hip replacement  Estimated body mass index is 41.06 kg/m as calculated from the following:   Height as of this encounter: 5' 8.5" (1.74 m).   Weight as of this encounter: 124.3 kg. Advance diet Up with therapy Work on  BM, enema ordered today Labs and VSS. Encourage incentive spirometer Pain controlled CM to assist with discharge. SNF unable to receive patient until 12/09/20.  Patient making daily progress with PT. Should be able to dc home in the next day or two. Will continue to monitor progress/stairs with PT  DVT Prophylaxis - Lovenox, TED hose, and SCDs Weight-Bearing as tolerated to right leg   T. Cranston Neighbor, PA-C [facility] 12/02/2020, 9:05 AM

## 2020-12-02 NOTE — Progress Notes (Addendum)
Inpatient Diabetes Program Recommendations  AACE/ADA: New Consensus Statement on Inpatient Glycemic Control   Target Ranges:  Prepandial:   less than 140 mg/dL      Peak postprandial:   less than 180 mg/dL (1-2 hours)      Critically ill patients:  140 - 180 mg/dL   Results for NICKLOS, GAXIOLA (MRN [id number]) as of 12/02/2020 12:01  Ref. Range 12/01/2020 09:20 12/01/2020 12:06 12/01/2020 16:06 12/01/2020 20:23 12/02/2020 08:37  Glucose-Capillary Latest Ref Range: 70 - 99 mg/dL 828 (H) 003 (H) 491 (H) 317 (H) 256 (H)    Review of Glycemic Control  Current orders for Inpatient glycemic control: Metformin 500 mg daily, Glipizide 10 mg daily, Novolog 0-15 units Q6H PRN, Farxiga 10 mg daily  Inpatient Diabetes Program Recommendations:    Insulin: Please consider changing frequency of CBGs to AC&HS and Novolog to 0-15 units AC&HS. Also, please consider ordering Novolog 3 units TID with meals for meal coverage if patient eats at least 50% of meals.  Thanks, Orlando Penner, RN, MSN, CDE Diabetes Coordinator Inpatient Diabetes Program [phone] (Team Pager from 8am to 5pm)

## 2020-12-03 LAB — GLUCOSE, CAPILLARY
Glucose-Capillary: 196 mg/dL — ABNORMAL HIGH (ref 70–99)
Glucose-Capillary: 313 mg/dL — ABNORMAL HIGH (ref 70–99)
Glucose-Capillary: 210 mg/dL — ABNORMAL HIGH (ref 70–99)
Glucose-Capillary: 288 mg/dL — ABNORMAL HIGH (ref 70–99)

## 2020-12-03 NOTE — Progress Notes (Addendum)
   Subjective: 8 Days Post-Op Procedure(s) (LRB): TOTAL HIP ARTHROPLASTY ANTERIOR APPROACH (Right) Patient reports pain as mild.   Patient is  doing well, no complaints.  +BM,Tolerating po well + covid, afebrile. No cough/CP/SOB We will continue therapy today. Making good progress with PT   Objective: Vital signs in last 24 hours: Temp:  [97.7 F (36.5 C)-98.8 F (37.1 C)] 98.3 F (36.8 C) (06/17 0435) Pulse Rate:  [67-94] 79 (06/17 0435) Resp:  [17-18] 18 (06/17 0435) BP: (114-134)/(66-81) 134/75 (06/17 0435) SpO2:  [96 %-100 %] 100 % (06/17 0435)  Intake/Output from previous day: 06/16 0701 - 06/17 0700 In: -  Out: 850 [Urine:850] Intake/Output this shift: No intake/output data recorded.  No results for input(s): HGB in the last 72 hours.  No results for input(s): WBC, RBC, HCT, PLT in the last 72 hours.  Recent Labs    12/02/20 0357  CREATININE 1.40*    No results for input(s): LABPT, INR in the last 72 hours.  EXAM General - Patient is Alert, Appropriate, and Oriented Extremity - Neurovascular intact Sensation intact distally Intact pulses distally Dorsiflexion/Plantar flexion intact Dressing - dressing C/D/I and no drainage, prevena intact with out drainage Motor Function - intact, moving foot and toes well on exam.   Past Medical History:  Diagnosis Date   Anemia    Complication of anesthesia    woke up during colonoscopy    Diabetes mellitus without complication (HCC)    Diverticulitis large intestine    History of hiatal hernia    has been repaired   Hypertension    Pneumonia     Assessment/Plan:   8 Days Post-Op Procedure(s) (LRB): TOTAL HIP ARTHROPLASTY ANTERIOR APPROACH (Right) Active Problems:   S/P hip replacement  Estimated body mass index is 41.06 kg/m as calculated from the following:   Height as of this encounter: 5' 8.5" (1.74 m).   Weight as of this encounter: 124.3 kg. Advance diet Up with therapy Labs and VSS. Pain  controlled CM to assist with discharge. SNF unable to receive patient until 12/09/20.  Patient making daily progress with PT. Should be able to dc home in the next day or two. Unable to find Home health agency. Will continue to monitor progress/stairs with PT.   Patient agreeable to discharge to home Saturday  DVT Prophylaxis - Lovenox, TED hose, and SCDs Weight-Bearing as tolerated to right leg   T. Cranston Neighbor, PA-C [facility] 12/03/2020, 7:48 AM

## 2020-12-03 NOTE — Progress Notes (Signed)
Physical Therapy Treatment Patient Details Name: Jahel Wavra MRN: [id number] DOB: 1961-05-26 Today's Date: 12/03/2020    History of Present Illness Pt is a 60 y.o. 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  PMH includes htn, hiatal hernia, DM, anemia, diverticulitis, PNA, colon surgery.    PT Comments    To PT gym to complete step training and gait.  Pt with supervision for gait and min guard for stairs.  Discussed navigation at home and overall safety.  No LOB or buckling with stairs and generally safe technique despite Trendelenberg drop.     Follow Up Recommendations  Home health PT     Equipment Recommendations  Rolling walker with 5" wheels;3in1 (PT)    Recommendations for Other Services       Precautions / Restrictions Precautions Precautions: Anterior Hip;Fall Restrictions Weight Bearing Restrictions: Yes RLE Weight Bearing: Weight bearing as tolerated    Mobility  Bed Mobility               General bed mobility comments: in recliner pre and post session    Transfers Overall transfer level: Modified independent Equipment used: Rolling walker (2 wheeled) Transfers: Sit to/from Stand Sit to Stand: Modified independent (Device/Increase time)            Ambulation/Gait Ambulation/Gait assistance: Modified independent (Device/Increase time) Gait Distance (Feet): 240 Feet Assistive device: Rolling walker (2 wheeled) Gait Pattern/deviations: Antalgic;Step-to pattern Gait velocity: decreased   General Gait Details: 6 laps in room with supervision.   Stairs Stairs: Yes Stairs assistance: Supervision;Min guard Stair Management: One rail Right Number of Stairs: 16 General stair comments: 2 bouts of 8 steps.  No assist needed, tried different UE and LE strategies to maximize comfort and effectiveness.  per typical some hesitancy but no overt safety issues of assist needed   Wheelchair Mobility    Modified Rankin (Stroke Patients  Only)       Balance     Sitting balance-Leahy Scale: Good       Standing balance-Leahy Scale: Good                              Cognition Arousal/Alertness: Awake/alert Behavior During Therapy: WFL for tasks assessed/performed Overall Cognitive Status: Within Functional Limits for tasks assessed                                        Exercises Other Exercises Other Exercises: self initiates standing ex during gait    General Comments        Pertinent Vitals/Pain Pain Assessment: Faces Faces Pain Scale: Hurts little more Pain Location: R hip and thigh mostly with SLS activities Pain Descriptors / Indicators: Tightness Pain Intervention(s): Limited activity within patient's tolerance;Monitored during session;Repositioned    Home Living                      Prior Function            PT Goals (current goals can now be found in the care plan section) Progress towards PT goals: Progressing toward goals    Frequency    BID      PT Plan Current plan remains appropriate    Co-evaluation              AM-PAC PT "6 Clicks" Mobility   Outcome Measure  Help needed turning from your back to your side while in a flat bed without using bedrails?: None Help needed moving from lying on your back to sitting on the side of a flat bed without using bedrails?: None Help needed moving to and from a bed to a chair (including a wheelchair)?: None Help needed standing up from a chair using your arms (e.g., wheelchair or bedside chair)?: None Help needed to walk in hospital room?: A Little Help needed climbing 3-5 steps with a railing? : A Little 6 Click Score: 22    End of Session Equipment Utilized During Treatment: Gait belt Activity Tolerance: Patient tolerated treatment well Patient left: in chair;with call bell/phone within reach;with chair alarm set Nurse Communication: Mobility status PT Visit Diagnosis: Other  abnormalities of gait and mobility (R26.89);Muscle weakness (generalized) (M62.81);Difficulty in walking, not elsewhere classified (R26.2);Pain Pain - Right/Left: Right Pain - part of body: Hip     Time: 1430-1455 PT Time Calculation (min) (ACUTE ONLY): 25 min  Charges:  $Gait Training: 8-22 mins                    Danielle Dess, PTA 12/03/20, 3:55 PM , 3:53 PM

## 2020-12-03 NOTE — Progress Notes (Signed)
Physical Therapy Treatment Patient Details Name: Noah Orozco MRN: [id number] DOB: 1960-10-10 Today's Date: 12/03/2020    History of Present Illness Pt is a 60 y.o. 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  PMH includes htn, hiatal hernia, DM, anemia, diverticulitis, PNA, colon surgery.    PT Comments    Pt stood and walked x 6 laps in room with supervision.  No LOB or buckling.  Stated he will discharge home in AM and did not feel the need to practice stairs again before discharge.  Education for mobility, safety and exercise but pt seemed a bit short and not interested at this time.  Safe with mobility.   Follow Up Recommendations  Home health PT     Equipment Recommendations  Rolling walker with 5" wheels;3in1 (PT)    Recommendations for Other Services       Precautions / Restrictions Precautions Precautions: Anterior Hip;Fall Restrictions Weight Bearing Restrictions: Yes RLE Weight Bearing: Weight bearing as tolerated    Mobility  Bed Mobility               General bed mobility comments: in recliner pre and post session    Transfers Overall transfer level: Modified independent Equipment used: Rolling walker (2 wheeled) Transfers: Sit to/from Stand Sit to Stand: Modified independent (Device/Increase time)            Ambulation/Gait Ambulation/Gait assistance: Modified independent (Device/Increase time) Gait Distance (Feet): 240 Feet Assistive device: Rolling walker (2 wheeled) Gait Pattern/deviations: Antalgic;Step-to pattern Gait velocity: decreased   General Gait Details: 6 laps in room with supervision.   Stairs Stairs: Yes Stairs assistance: Supervision;Min guard Stair Management: One rail Right Number of Stairs: 16 General stair comments: 2 bouts of 8 steps.  No assist needed, tried different UE and LE strategies to maximize comfort and effectiveness.  per typical some hesitancy but no overt safety issues of assist  needed   Wheelchair Mobility    Modified Rankin (Stroke Patients Only)       Balance     Sitting balance-Leahy Scale: Good       Standing balance-Leahy Scale: Good                              Cognition Arousal/Alertness: Awake/alert Behavior During Therapy: WFL for tasks assessed/performed Overall Cognitive Status: Within Functional Limits for tasks assessed                                        Exercises Other Exercises Other Exercises: self initiates standing ex during gait    General Comments        Pertinent Vitals/Pain Pain Assessment: Faces Faces Pain Scale: Hurts little more Pain Location: R hip and thigh mostly with SLS activities Pain Descriptors / Indicators: Tightness Pain Intervention(s): Limited activity within patient's tolerance;Monitored during session;Repositioned    Home Living                      Prior Function            PT Goals (current goals can now be found in the care plan section) Progress towards PT goals: Progressing toward goals    Frequency    BID      PT Plan Current plan remains appropriate    Co-evaluation  AM-PAC PT "6 Clicks" Mobility   Outcome Measure  Help needed turning from your back to your side while in a flat bed without using bedrails?: None Help needed moving from lying on your back to sitting on the side of a flat bed without using bedrails?: None Help needed moving to and from a bed to a chair (including a wheelchair)?: None Help needed standing up from a chair using your arms (e.g., wheelchair or bedside chair)?: None Help needed to walk in hospital room?: A Little Help needed climbing 3-5 steps with a railing? : A Little 6 Click Score: 22    End of Session Equipment Utilized During Treatment: Gait belt Activity Tolerance: Patient tolerated treatment well Patient left: in chair;with call bell/phone within reach;with chair alarm  set Nurse Communication: Mobility status PT Visit Diagnosis: Other abnormalities of gait and mobility (R26.89);Muscle weakness (generalized) (M62.81);Difficulty in walking, not elsewhere classified (R26.2);Pain Pain - Right/Left: Right Pain - part of body: Hip     Time: 1430-1455 PT Time Calculation (min) (ACUTE ONLY): 25 min  Charges:  $Gait Training: 8-22 mins                    Danielle Dess, PTA 12/03/20, 3:52 PM , 3:50 PM

## 2020-12-03 NOTE — Progress Notes (Signed)
Pt Noah Orozco was Covid positive on 11/29/20 he is right total hip replacement by Dr. Rosita Kea. He was to have a 10 day quarantine period. Pt had D/C orders SNF would not take him till the 23 of June, and a home health search was started by CSW. Today on 12/03/20 it was stated that pt would not be leaving for SNF until the 23 of June or going home with home health due to insurance issues for Home health. I made Amador Cunas, [place] aware of the situation and he said he is still stable for D/C tomorrow 12/04/20. Made CSW aware of the situation.

## 2020-12-04 LAB — GLUCOSE, CAPILLARY: Glucose-Capillary: 178 mg/dL — ABNORMAL HIGH (ref 70–99)

## 2020-12-04 MED ORDER — ENOXAPARIN SODIUM 40 MG/0.4ML IJ SOSY: 40.0000 mg | PREFILLED_SYRINGE | INTRAMUSCULAR | 0 refills | Status: AC

## 2020-12-04 NOTE — TOC Transition Note (Signed)
Transition of Care [facility]) - CM/SW Discharge Note   Patient Details  Name: Noah Orozco MRN: [id number] Date of Birth: 1960/09/29  Transition of Care [facility]) CM/SW Contact:  Luvenia Redden, RN Phone Number:[phone] 12/04/2020, 1:29 PM   Clinical Narrative:    Pt has discharged home today and needs RW and 3-1 commode (bariatric). [employer]) agency will deliver directly to the pt's home in [place]. Agency will contact pt directly for these arrangements.  No additional needs presented at this time.     Barriers to Discharge: Continued Medical Work up   Patient Goals and CMS Choice   CMS Medicare.gov Compare Post Acute Care list provided to:: Patient (Wife and daughter at bedside)    Discharge Placement                       Discharge Plan and Services     Post Acute Care Choice: Skilled Nursing Facility                               Social Determinants of Health (SDOH) Interventions     Readmission Risk Interventions No flowsheet data found.

## 2020-12-04 NOTE — Progress Notes (Addendum)
  Subjective: 9 Days Post-Op Procedure(s) (LRB): TOTAL HIP ARTHROPLASTY ANTERIOR APPROACH (Right) Patient reports pain as well-controlled.  He does request pain medication as last medicine was at 5 AM Patient is well, and has had no acute complaints or problems Plan is to go Home after hospital stay. Unable to obtain [facility] PT due to insurance issues, and unable to go to SNF due to Covid dx Negative for chest pain and shortness of breath Fever: no Gastrointestinal: negative for nausea and vomiting.  Patient has had a bowel movement.  Objective: Vital signs in last 24 hours: Temp:  [97.9 F (36.6 C)-98.3 F (36.8 C)] 98.2 F (36.8 C) (06/18 0932) Pulse Rate:  [65-81] 73 (06/18 0932) Resp:  [16-18] 18 (06/18 0932) BP: (109-118)/(64-71) 115/70 (06/18 0932) SpO2:  [99 %-100 %] 99 % (06/18 0932)  Intake/Output from previous day:  Intake/Output Summary (Last 24 hours) at 12/04/2020 1023 Last data filed at 12/04/2020 0354 Gross per 24 hour  Intake --  Output 350 ml  Net -350 ml    Intake/Output this shift: No intake/output data recorded.  Labs: No results for input(s): HGB in the last 72 hours. No results for input(s): WBC, RBC, HCT, PLT in the last 72 hours. Recent Labs    12/02/20 0357  CREATININE 1.40*   No results for input(s): LABPT, INR in the last 72 hours.   EXAM General - Patient is Alert, Appropriate, and Oriented Extremity - Neurovascular intact Dorsiflexion/Plantar flexion intact Compartment soft Dressing/Incision -Prevena in place and working, no drainage in Barrister's [employer] Function - intact, moving foot and toes well on exam.    Cardiovascular- Regular rate and rhythm, no murmurs/rubs/gallops Respiratory- Lungs clear to auscultation bilaterally Gastrointestinal- soft and nontender   Assessment/Plan: 9 Days Post-Op Procedure(s) (LRB): TOTAL HIP ARTHROPLASTY ANTERIOR APPROACH (Right) Active Problems:   S/P hip replacement  Estimated body mass index is  41.06 kg/m as calculated from the following:   Height as of this encounter: 5' 8.5" (1.74 m).   Weight as of this encounter: 124.3 kg. Advance diet Up with therapy  Discharge today with patient to f/u next week for staple removal.   **Addendum: patient has not received bariatric walker or BSC. Reached out to case management, haven't heard back. Patient being allowed to d/c at this time. *  DVT Prophylaxis - Lovenox, Ted hose, and SCDs Weight-Bearing as tolerated to right leg  Baldwin Jamaica, PA-C [facility] 12/04/2020, 10:23 AM

## 2020-12-04 NOTE — Progress Notes (Signed)
VSS, iv cath removed site was clean, dry and intact. D/C packet went over with pt he verbalized understanding of the information provided. Pt was ready to d/c and he asked about his dme and I told Ardelle Balls he did not receive it yet. I informed pt we did call and it will be delivered to his home he was upset and voiced his opinions of the situation. Pt was transported by tech with all belongings via W/C to private car   Called Barbie the weekend CSW and she is having the equipment delivered to his place of residence.

## 2020-12-04 NOTE — Progress Notes (Signed)
Physical Therapy Treatment Patient Details Name: Noah Orozco MRN: [id number] DOB: 04-10-1961 Today's Date: 12/04/2020    History of Present Illness Pt is a 60 y.o. 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  PMH includes htn, hiatal hernia, DM, anemia, diverticulitis, PNA, colon surgery.    PT Comments    Pt seen for PT evaluation with pt requiring min encouragement to participate. Pt is able to complete bed mobility & sit<>stand transfer from low bed with mod I (educated pt on home modifications to increase seat height if necessary). Pt has continent void sitting EOB with mod I. Pt is able to ambulate multiple laps in room with RW & mod I with decreased gait speed & pt reporting stiffness in RLE. Pt voices concerns re: d/c home but when PT asks him to elaborate pt reports everything is figured out. Pt would benefit from HHPT f/u upon d/c.    Follow Up Recommendations  Home health PT;Supervision - Intermittent     Equipment Recommendations  Rolling walker with 5" wheels;3in1 (PT)    Recommendations for Other Services       Precautions / Restrictions Precautions Precautions: Anterior Hip;Fall Precaution Comments: wound vac Restrictions Weight Bearing Restrictions: Yes RLE Weight Bearing: Weight bearing as tolerated    Mobility  Bed Mobility   Bed Mobility: Supine to Sit     Supine to sit: Modified independent (Device/Increase time);HOB elevated     General bed mobility comments: HOB significantly elevated    Transfers Overall transfer level: Modified independent Equipment used: Rolling walker (2 wheeled) Transfers: Sit to/from Stand Sit to Stand: Modified independent (Device/Increase time)         General transfer comment: able to stand from low EOB  Ambulation/Gait Ambulation/Gait assistance: Modified independent (Device/Increase time) Gait Distance (Feet): 176 Feet Assistive device: Rolling walker (2 wheeled) Gait Pattern/deviations:  Step-through pattern;Decreased step length - right;Decreased step length - left;Decreased stride length Gait velocity: decreased       Stairs             Wheelchair Mobility    Modified Rankin (Stroke Patients Only)       Balance Overall balance assessment: Needs assistance Sitting-balance support: No upper extremity supported;Feet supported Sitting balance-Leahy Scale: Normal     Standing balance support: Bilateral upper extremity supported;During functional activity Standing balance-Leahy Scale: Good                              Cognition Arousal/Alertness: Awake/alert Behavior During Therapy: Flat affect Overall Cognitive Status: Within Functional Limits for tasks assessed                                 General Comments: Pt somewhat short during session, reports he has concerns re: d/c home & moving around his house but when PT encourages him to problem solve & discuss concerns with her he declines, reporting they've got it figured out & he's ready to d/c home today.      Exercises Total Joint Exercises Heel Slides: AROM;Strengthening;Right;5 reps Hip ABduction/ADduction: AROM;Strengthening;10 reps (hip adduction pillow squeezes sitting in recliner) Long Arc Quad: Strengthening;10 reps;AROM;Right    General Comments        Pertinent Vitals/Pain Pain Assessment: 0-10 Pain Score:  ("8.5") Pain Location: R thigh Pain Descriptors / Indicators: Tightness (stiffness) Pain Intervention(s): Monitored during session;Limited activity within patient's tolerance (pt reports he's  due for meds at 9AM)    Home Living                      Prior Function            PT Goals (current goals can now be found in the care plan section) Acute Rehab PT Goals Patient Stated Goal: rehab then home PT Goal Formulation: With patient Potential to Achieve Goals: Good Progress towards PT goals: Progressing toward goals    Frequency     BID      PT Plan Current plan remains appropriate    Co-evaluation              AM-PAC PT "6 Clicks" Mobility   Outcome Measure  Help needed turning from your back to your side while in a flat bed without using bedrails?: None Help needed moving from lying on your back to sitting on the side of a flat bed without using bedrails?: None Help needed moving to and from a bed to a chair (including a wheelchair)?: None Help needed standing up from a chair using your arms (e.g., wheelchair or bedside chair)?: None Help needed to walk in hospital room?: None Help needed climbing 3-5 steps with a railing? : A Little 6 Click Score: 23    End of Session   Activity Tolerance: Patient tolerated treatment well Patient left: in chair;with call bell/phone within reach;with chair alarm set Nurse Communication: Mobility status PT Visit Diagnosis: Other abnormalities of gait and mobility (R26.89);Muscle weakness (generalized) (M62.81);Difficulty in walking, not elsewhere classified (R26.2);Pain Pain - Right/Left: Right Pain - part of body: Hip     Time: 5364-6803 PT Time Calculation (min) (ACUTE ONLY): 23 min  Charges:  $Therapeutic Activity: 23-37 mins                     Noah Orozco, PT, DPT 12/04/20, 9:32 AM    Sandi Mariscal 12/04/2020, 9:31 AM

## 2020-12-06 NOTE — TOC Progression Note (Signed)
Transition of Care [facility]) - Progression Note    Patient Details  Name: Noah Orozco MRN: [id number] Date of Birth: [birth year]  Transition of Care [facility]) CM/SW Contact  Barrie Dunker, RN Phone Number: 12/06/2020, 10:19 AM  Clinical Narrative:     Patient's wife called inquiring when they will get the DME, I called [employer] and left a VM requesting ETA on the DME, will call the patient back once get confirmation       Expected Discharge Plan and Services                                                 Social Determinants of Health (SDOH) Interventions    Readmission Risk Interventions No flowsheet data found.

## 2021-07-31 IMAGING — DX DG HIP (WITH OR WITHOUT PELVIS) 2-3V*R*
2 series · 2 of 2 positions shown · non-contrast
Comparison: None.

CLINICAL DATA: Status post right hip replacement.

EXAM:
DG HIP (WITH OR WITHOUT PELVIS) 2-3V RIGHT

[hip ap]
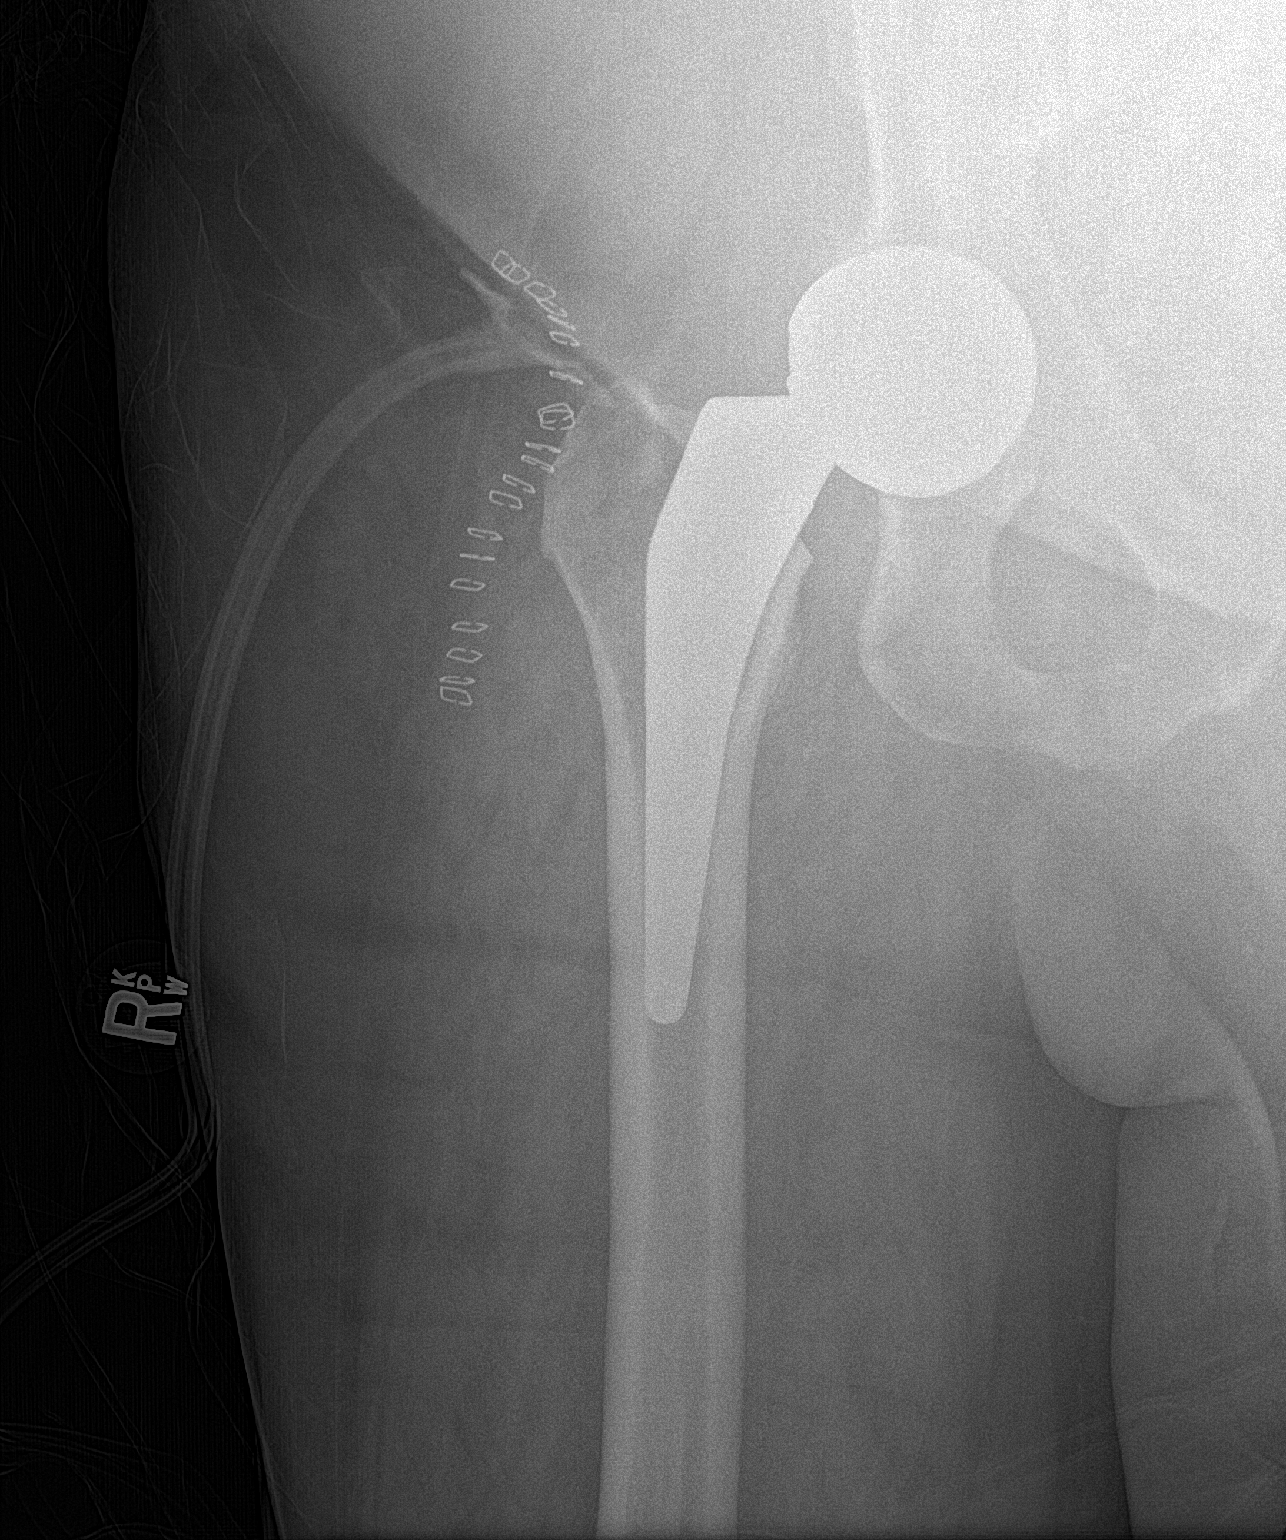

[hip lat]
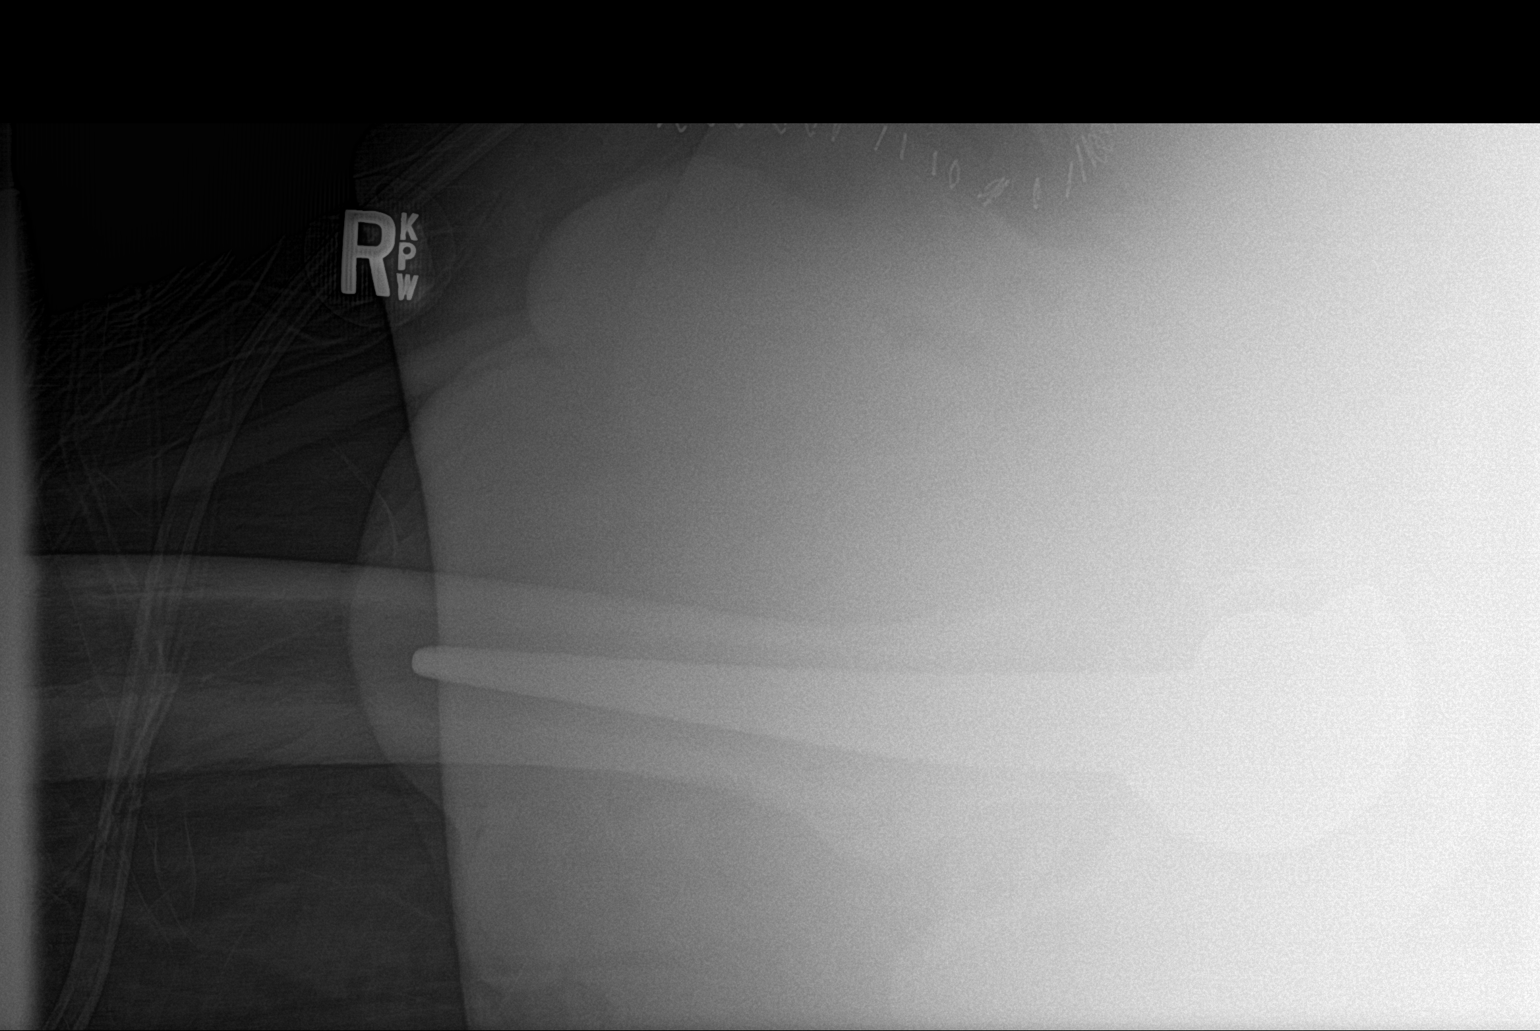

[2 of 2 positions shown; findings below may reference images not displayed]

FINDINGS: Right hip prosthesis appears to be well situated. Expected
postoperative changes are seen in the surrounding soft tissues.
IMPRESSION: Status post right hip arthroplasty.
